# Patient Record
Sex: Female | Born: 1990 | Race: Black or African American | Hispanic: No | Marital: Single | Smoking: Current every day smoker
Health system: Southern US, Community
[De-identification: ages and names within clinical notes are randomized; demographics above are authoritative.]

## PROBLEM LIST (undated history)

## (undated) DIAGNOSIS — C50919 Malignant neoplasm of unspecified site of unspecified female breast: Secondary | ICD-10-CM

## (undated) HISTORY — DX: Malignant neoplasm of unspecified site of unspecified female breast: C50.919

---

## 2012-11-08 ENCOUNTER — Emergency Department (HOSPITAL_COMMUNITY)
Admission: EM | Admit: 2012-11-08 | Discharge: 2012-11-08 | Disposition: A | Discharge status: Home / Self Care | Payer: Self-pay | Attending: Emergency Medicine | Admitting: Emergency Medicine

## 2012-11-08 ENCOUNTER — Emergency Department (HOSPITAL_COMMUNITY): Payer: Self-pay

## 2012-11-08 ENCOUNTER — Encounter (HOSPITAL_COMMUNITY): Payer: Self-pay | Admitting: Emergency Medicine

## 2012-11-08 DIAGNOSIS — R071 Chest pain on breathing: Secondary | ICD-10-CM | POA: Insufficient documentation

## 2012-11-08 DIAGNOSIS — J3489 Other specified disorders of nose and nasal sinuses: Secondary | ICD-10-CM | POA: Insufficient documentation

## 2012-11-08 DIAGNOSIS — W57XXXA Bitten or stung by nonvenomous insect and other nonvenomous arthropods, initial encounter: Secondary | ICD-10-CM

## 2012-11-08 DIAGNOSIS — Y9319 Activity, other involving water and watercraft: Secondary | ICD-10-CM | POA: Insufficient documentation

## 2012-11-08 DIAGNOSIS — R0789 Other chest pain: Secondary | ICD-10-CM

## 2012-11-08 DIAGNOSIS — F17200 Nicotine dependence, unspecified, uncomplicated: Secondary | ICD-10-CM | POA: Insufficient documentation

## 2012-11-08 DIAGNOSIS — L089 Local infection of the skin and subcutaneous tissue, unspecified: Secondary | ICD-10-CM | POA: Insufficient documentation

## 2012-11-08 DIAGNOSIS — R059 Cough, unspecified: Secondary | ICD-10-CM | POA: Insufficient documentation

## 2012-11-08 DIAGNOSIS — R05 Cough: Secondary | ICD-10-CM | POA: Insufficient documentation

## 2012-11-08 DIAGNOSIS — Y929 Unspecified place or not applicable: Secondary | ICD-10-CM | POA: Insufficient documentation

## 2012-11-08 MED ORDER — SULFAMETHOXAZOLE-TRIMETHOPRIM 800-160 MG PO TABS: 1.0000 | ORAL_TABLET | Freq: Two times a day (BID) | ORAL | Status: DC

## 2012-11-08 NOTE — ED Notes (Signed)
Pt c/o possible insect bite to posterior r forearm just below elbow x 5 days ago. Pt had it I&D and packed. States it supposed to be repacked today. Pt c/o central chest tightness/aching started today. Denies sob/n/v/d/dizziness. Pt c/o congestion/cough after insect bite. Has been coughing yellow sputum x 2 days now. Alert/oriented. Nad. No resp distress. Wound to elbow has no s/s of infection at this time.

## 2012-11-08 NOTE — ED Provider Notes (Signed)
History     CSN: [id number]  Arrival date & time 11/08/12  1015   First MD Initiated Contact with Patient 11/08/12 1030      Chief Complaint  Patient presents with  . Chest Pain  . Insect Bite    (Consider location/radiation/quality/duration/timing/severity/associated sxs/prior treatment) Patient is a 22 y.o. female presenting with chest pain. The history is provided by the patient.  Chest Pain Chest pain location: midchest. Pain quality: sharp   Pain radiates to:  Does not radiate Pain radiates to the back: no   Pain severity:  Moderate Onset quality:  Gradual Duration:  1 day Timing:  Intermittent Progression:  Unchanged Chronicity:  New Context: movement   Context comment:  Coughing Relieved by:  None tried Associated symptoms: cough   Associated symptoms: no abdominal pain, no anxiety, no back pain, no fever, no nausea, no shortness of breath and not vomiting    Donna [name] is a 22 y.o. female who presents to the ED for recheck of a wound on her right forearm that required I&D 5 days ago. She has been taking Clindamycin. She also complains of chest pain that she has had with a cough for 2 days. The cough is productive with yellow sputum. The chest pain is sharp and increases with cough or movement. She denies shortness of breath, feeling weak, nausea or vomiting or other problems.   History reviewed. No pertinent past medical history.  History reviewed. No pertinent past surgical history.  History reviewed. No pertinent family history.  History  Substance Use Topics  . Smoking status: Current Every Day Smoker  . Smokeless tobacco: Not on file  . Alcohol Use: Yes     Comment: weekends    OB History   Grav Para Term Preterm Abortions TAB SAB Ect Mult Living                  Review of Systems  Constitutional: Negative for fever, chills and activity change.  HENT: Negative for neck pain.   Respiratory: Positive for cough. Negative for shortness of  breath.   Cardiovascular: Positive for chest pain.  Gastrointestinal: Negative for nausea, vomiting and abdominal pain.  Musculoskeletal: Negative for back pain.  Psychiatric/Behavioral: The patient is not nervous/anxious.     Allergies  Review of patient's allergies indicates no known allergies.  Home Medications  No current outpatient prescriptions on file.  LMP 10/16/2012  Physical Exam  Nursing note and vitals reviewed. Constitutional: She is oriented to person, place, and time. She appears well-developed and well-nourished. No distress.  HENT:  Head: Normocephalic and atraumatic.  Eyes: EOM are normal. Pupils are equal, round, and reactive to light.  Neck: Neck supple.  Cardiovascular: Normal rate, regular rhythm and normal heart sounds.   Pulmonary/Chest: Effort normal and breath sounds normal. No respiratory distress. She exhibits tenderness.  Abdominal: Soft. There is no tenderness.  Musculoskeletal: She exhibits no edema.  There is a wound noted to the right forearm where the patient had an abscess and it was I&D. The area is healing well  Neurological: She is alert and oriented to person, place, and time. No cranial nerve deficit.  Skin: Skin is warm and dry.  Psychiatric: She has a normal mood and affect. Her behavior is normal. Judgment and thought content normal.  Dg Chest 2 View  11/08/2012   *RADIOLOGY REPORT*  Clinical Data: Spider bite.  Chest pain.  CHEST - 2 VIEW  Comparison: No priors.  Findings: Lung volumes are normal.  No consolidative airspace disease.  No pleural effusions.  No pneumothorax.  No pulmonary nodule or mass noted.  Pulmonary vasculature and the cardiomediastinal silhouette are within normal limits.  IMPRESSION: 1. No radiographic evidence of acute cardiopulmonary disease.   Original Report Authenticated By: Trudie Reed, M.D.     ED Course  Procedures (including critical care time) Dr. Adriana Simas in to examine the patient. Discussed plan of care.   MDM  22 y.o. female with chest wall pain, cough and congestion. Healing wound right forearm. Will change antibiotic from clindamycin to Septra DS. She will follow up with her doctor.  Discussed with the patient and all questioned fully answered. She will return if any problems arise.    Medication List    TAKE these medications       sulfamethoxazole-trimethoprim 800-160 MG per tablet  Commonly known as:  SEPTRA DS  Take 1 tablet by mouth every 12 (twelve) hours.      ASK your doctor about these medications       clindamycin 150 MG capsule  Commonly known as:  CLEOCIN  Take 300 mg by mouth 3 (three) times daily. Take for 7 days, patient started medication on 11/07/2012     NYQUIL COLD & FLU PO  Take 10 mLs by mouth at bedtime as needed (cough).               [place], [place] 11/08/12 [phone]

## 2012-11-10 NOTE — ED Provider Notes (Signed)
Medical screening examination/treatment/procedure(s) were conducted as a shared visit with non-physician practitioner(s) and myself.  I personally evaluated the patient during the encounter.  Will change to anti MRSA ATB  Donnetta Hutching, MD 11/10/12 1121

## 2015-08-29 ENCOUNTER — Ambulatory Visit (INDEPENDENT_AMBULATORY_CARE_PROVIDER_SITE_OTHER): Payer: BLUE CROSS/BLUE SHIELD | Admitting: Physician Assistant

## 2015-08-29 VITALS — BP 120/76 | HR 46 | Temp 98.5°F | Resp 14 | Ht 75.0 in | Wt 177.6 lb

## 2015-08-29 DIAGNOSIS — M25511 Pain in right shoulder: Secondary | ICD-10-CM

## 2015-08-29 DIAGNOSIS — Z0000 Encounter for general adult medical examination without abnormal findings: Secondary | ICD-10-CM

## 2015-08-29 DIAGNOSIS — Z13220 Encounter for screening for lipoid disorders: Secondary | ICD-10-CM | POA: Diagnosis not present

## 2015-08-29 DIAGNOSIS — Z131 Encounter for screening for diabetes mellitus: Secondary | ICD-10-CM

## 2015-08-29 DIAGNOSIS — Z124 Encounter for screening for malignant neoplasm of cervix: Secondary | ICD-10-CM | POA: Diagnosis not present

## 2015-08-29 DIAGNOSIS — M25561 Pain in right knee: Secondary | ICD-10-CM | POA: Diagnosis not present

## 2015-08-29 DIAGNOSIS — M2170 Unequal limb length (acquired), unspecified site: Secondary | ICD-10-CM

## 2015-08-29 DIAGNOSIS — Z113 Encounter for screening for infections with a predominantly sexual mode of transmission: Secondary | ICD-10-CM

## 2015-08-29 LAB — COMPREHENSIVE METABOLIC PANEL
ALBUMIN: 4.6 g/dL (ref 3.6–5.1)
ALT: 10 U/L (ref 6–29)
AST: 21 U/L (ref 10–30)
Alkaline Phosphatase: 40 U/L (ref 33–115)
BUN: 10 mg/dL (ref 7–25)
CHLORIDE: 104 mmol/L (ref 98–110)
CO2: 26 mmol/L (ref 20–31)
CREATININE: 0.92 mg/dL (ref 0.50–1.10)
Calcium: 9.3 mg/dL (ref 8.6–10.2)
Glucose, Bld: 70 mg/dL (ref 65–99)
POTASSIUM: 4.3 mmol/L (ref 3.5–5.3)
Sodium: 141 mmol/L (ref 135–146)
TOTAL PROTEIN: 7.3 g/dL (ref 6.1–8.1)
Total Bilirubin: 0.7 mg/dL (ref 0.2–1.2)

## 2015-08-29 LAB — CBC
HCT: 44 % (ref 36.0–46.0)
HEMOGLOBIN: 14.9 g/dL (ref 12.0–15.0)
MCH: 29.4 pg (ref 26.0–34.0)
MCHC: 33.9 g/dL (ref 30.0–36.0)
MCV: 86.8 fL (ref 78.0–100.0)
MPV: 11.8 fL (ref 8.6–12.4)
Platelets: 233 10*3/uL (ref 150–400)
RBC: 5.07 MIL/uL (ref 3.87–5.11)
RDW: 13.3 % (ref 11.5–15.5)
WBC: 6.4 10*3/uL (ref 4.0–10.5)

## 2015-08-29 LAB — LIPID PANEL
CHOL/HDL RATIO: 1.8 ratio (ref <=5.0)
CHOLESTEROL: 129 mg/dL (ref 125–200)
HDL: 71 mg/dL (ref >=46)
LDL Cholesterol: 49 mg/dL (ref <130)
TRIGLYCERIDES: 46 mg/dL (ref <150)
VLDL: 9 mg/dL (ref <30)

## 2015-08-29 NOTE — Patient Instructions (Addendum)
You will get a phone call to make appt with ortho I will call you with your lab results. Keep a diary of the past 24 hours when you get the rash on your arms - what you were doing, where you were, what skin products you used, what foods you ate, etc. That may help you determine the cause of your allergy Return as needed.    IF you received an x-ray today, you will receive an invoice from [facility]. Please contact [facility] at [phone] with questions or concerns regarding your invoice.   IF you received labwork today, you will receive an invoice from [facility]. Please contact [facility] at [phone] with questions or concerns regarding your invoice.   Our billing staff will not be able to assist you with questions regarding bills from these companies.  You will be contacted with the lab results as soon as they are available. The fastest way to get your results is to activate your My Chart account. Instructions are located on the last page of this paperwork. If you have not heard from us regarding the results in 2 weeks, please contact this office.

## 2015-08-29 NOTE — Progress Notes (Signed)
[facility] [address], [place] [address] [phone]  Date:  08/29/2015   Name:  Donna CottonDestiny [name]   DOB:  1990/08/11   MRN:  [id number]  PCP:  No primary care provider on file.    Chief Complaint: Annual Exam; STD Testing; and Rash   History of Present Illness:  This is a 25 y.o. female who is presenting for CPE.  Complaints: rash that has occurred twice over the past month. Got hives on bilateral arms and hands that was very pruritic. She took benadryl and went away. Has no known allergies. Wondering if she needs to be sent to an allergist for further evaluation. LMP: 08/09/15.  Last pap: Last pap 2010. Wants to do that today. Sexual history: sexually active with women. Wanting STD testing today. Immunizations: up to date on immunizations. Dentist: last had dental visit 02/2015 Eye: doesn't wear glasses or contacts. Diet/Exercise: plays basketball for exercise. Also does drills with the [employer] and [employer]. States diet is variable. Sometimes eats healthy, sometimes doesn't.  Fam hx: does not know her family history except for DM.  Tobacco/alcohol/substance use: no/1 drink per week/no  Complaining of right shoulder pain x years. Right knee pain x several months. States her right leg is longer than her left and thinks that is what is causing her knee to hurt. She wants a referral to ortho.   Review of Systems:  Review of Systems  Constitutional: Negative.   HENT: Negative.   Eyes: Negative.   Respiratory: Negative.   Cardiovascular: Negative.   Gastrointestinal: Negative.   Endocrine: Negative.   Genitourinary: Negative.   Musculoskeletal: Negative.   Skin: Positive for rash.  Allergic/Immunologic: Negative.   Neurological: Negative.   Hematological: Negative.   Psychiatric/Behavioral: Negative.     There are no active problems to display for this patient.   Prior to Admission medications   Not on File    No Known Allergies  No past surgical  history on file.  Social History  Substance Use Topics  . Smoking status: Former Games developermoker  . Smokeless tobacco: None  . Alcohol Use: 0.0 oz/week    0 Standard drinks or equivalent per week     Comment: weekends    No family history on file.  Medication list has been reviewed and updated.  Physical Examination:  Physical Exam  Constitutional: She is oriented to person, place, and time. She appears well-developed and well-nourished. No distress.  HENT:  Head: Normocephalic and atraumatic.  Right Ear: Hearing, tympanic membrane, external ear and ear canal normal.  Left Ear: Hearing, tympanic membrane, external ear and ear canal normal.  Nose: Nose normal.  Mouth/Throat: Uvula is midline, oropharynx is clear and moist and mucous membranes are normal.  Eyes: Conjunctivae, EOM and lids are normal. Right eye exhibits no discharge. Left eye exhibits no discharge. No scleral icterus.  Neck: Trachea normal. Carotid bruit is not present. No thyromegaly present.  Cardiovascular: Normal rate, regular rhythm, normal heart sounds, intact distal pulses and normal pulses.   No murmur heard. Pulmonary/Chest: Effort normal and breath sounds normal. No respiratory distress. She has no wheezes. She has no rhonchi. She has no rales.  Breast exam declined  Abdominal: Soft. Normal appearance and bowel sounds are normal. She exhibits no abdominal bruit. There is no tenderness.  Genitourinary: Uterus normal. There is no lesion or injury on the right labia. There is no lesion or injury on the left labia. Cervix exhibits friability. Cervix exhibits no motion tenderness  and no discharge. Right adnexum displays no tenderness and no fullness. Left adnexum displays no tenderness and no fullness. No foreign body around the vagina. No vaginal discharge found.  Musculoskeletal: Normal range of motion.  FROM of bilateral shoulders. Pain with right shoulder abduction greater than 90 degrees. Catching in shoulder with  lowering arm.   Right leg almost 1 inch longer than left. Pain over lateral knee.  Lymphadenopathy:       Head (right side): No submental, no submandibular and no tonsillar adenopathy present.       Head (left side): No submental, no submandibular and no tonsillar adenopathy present.    She has no cervical adenopathy.  Neurological: She is alert and oriented to person, place, and time. She has normal strength and normal reflexes. No cranial nerve deficit or sensory deficit. Coordination and gait normal.  Skin: Skin is warm, dry and intact. No lesion and no rash noted.  Psychiatric: She has a normal mood and affect. Her speech is normal and behavior is normal. Thought content normal.   BP 120/76 mmHg  Pulse 46  Temp(Src) 98.5 F (36.9 C)  Resp 14  Ht  (1.905 m)  Wt 177 lb 9.6 oz (80.559 kg)  BMI 22.20 kg/m2  SpO2 99%  LMP 07/12/2015 (Approximate)   Visual Acuity Screening   Right eye Left eye Both eyes  Without correction:  With correction:      Assessment and Plan:  1. Annual physical exam Forms for insurance filled out. - CBC  2. Screen for STD (sexually transmitted disease) - RPR - HIV antibody - Trichomonas vaginalis, RNA  3. Diabetes mellitus screening - Comprehensive metabolic panel  4. Lipid screening - Lipid panel  5. Cervical cancer screening - Pap IG and Chlamydia/Gonococcus, NAA  6. Knee pain, right 7. Leg length discrepancy 8. Right shoulder pain - Ambulatory referral to Orthopedic Surgery   [place] Miners. Dyke Brackett, MHS [facility]  08/29/2015

## 2015-08-30 LAB — HIV ANTIBODY (ROUTINE TESTING W REFLEX): HIV 1&2 Ab, 4th Generation: NONREACTIVE

## 2015-08-30 LAB — PAP IG AND CT-NG NAA
CHLAMYDIA PROBE AMP: NOT DETECTED
GC PROBE AMP: NOT DETECTED

## 2015-08-30 LAB — TRICHOMONAS VAGINALIS, PROBE AMP: T vaginalis RNA: NEGATIVE

## 2015-08-30 LAB — RPR

## 2015-09-15 ENCOUNTER — Ambulatory Visit (INDEPENDENT_AMBULATORY_CARE_PROVIDER_SITE_OTHER): Payer: BLUE CROSS/BLUE SHIELD | Admitting: Physician Assistant

## 2015-09-15 VITALS — BP 122/72 | HR 78 | Temp 98.1°F | Resp 17 | Ht 75.5 in | Wt 185.0 lb

## 2015-09-15 DIAGNOSIS — J069 Acute upper respiratory infection, unspecified: Secondary | ICD-10-CM | POA: Diagnosis not present

## 2015-09-15 NOTE — Patient Instructions (Addendum)
Drink plenty of water (64 oz/day) and get plenty of rest. Take ibuprofen 600 mg three times a day or tylenol 1000 mg three times a day for your body aches and chills/hot flashes. If you have been prescribed mucinex, take twice a day with plenty of water. May take nyquil at night to help you sleep. If your symptoms are not improving in 1 week, return to clinic.     IF you received an x-ray today, you will receive an invoice from [facility]. Please contact [facility] at [phone] with questions or concerns regarding your invoice.   IF you received labwork today, you will receive an invoice from [facility]. Please contact [facility] at [phone] with questions or concerns regarding your invoice.   Our billing staff will not be able to assist you with questions regarding bills from these companies.  You will be contacted with the lab results as soon as they are available. The fastest way to get your results is to activate your My Chart account. Instructions are located on the last page of this paperwork. If you have not heard from us regarding the results in 2 weeks, please contact this office.

## 2015-09-15 NOTE — Progress Notes (Signed)
[facility] [address], [place] [address] [phone]  Date:  09/15/2015   Name:  Donna CottonDestiny [name]   DOB:  04-26-1991   MRN:  [id number]  PCP:  No primary care provider on file.    Chief Complaint: Sinusitis; Cough; and URI   History of Present Illness:  This is a 25 y.o. female who is presenting with cough and nasal congestion x 2 days ago. States her whole body hurts. She is having alternating hot flashes and chills. Cough is productive. No sob or wheezing. Denies sore throat.  Aggravating/alleviating factors: has not taken anything for her symptoms. History of asthma: no History of env allergies: no Tobacco use: black and milds, 2 per week. No sick contacts.  Review of Systems:  Review of Systems See HPI  There are no active problems to display for this patient.   Prior to Admission medications   Not on File    No Known Allergies  History reviewed. No pertinent past surgical history.  Social History  Substance Use Topics  . Smoking status: Current Every Day Smoker -- 0.50 packs/day for 9 years    Types: Cigars, Cigarettes  . Smokeless tobacco: None  . Alcohol Use: 0.0 oz/week    0 Standard drinks or equivalent per week     Comment: weekends    History reviewed. No pertinent family history.  Medication list has been reviewed and updated.  Physical Examination:  Physical Exam  Constitutional: She is oriented to person, place, and time. She appears well-developed and well-nourished. No distress.  HENT:  Head: Normocephalic and atraumatic.  Right Ear: Hearing, tympanic membrane, external ear and ear canal normal.  Left Ear: Hearing, tympanic membrane, external ear and ear canal normal.  Nose: Nose normal.  Mouth/Throat: Uvula is midline, oropharynx is clear and moist and mucous membranes are normal.  Eyes: Conjunctivae and lids are normal. Right eye exhibits no discharge. Left eye exhibits no discharge. No scleral icterus.   Cardiovascular: Normal rate, regular rhythm, normal heart sounds and normal pulses.   No murmur heard. Pulmonary/Chest: Effort normal and breath sounds normal. No respiratory distress. She has no wheezes. She has no rhonchi. She has no rales.  Musculoskeletal: Normal range of motion.  Lymphadenopathy:       Head (right side): Tonsillar adenopathy present. No submental and no submandibular adenopathy present.       Head (left side): No submental, no submandibular and no tonsillar adenopathy present.    She has no cervical adenopathy.  Neurological: She is alert and oriented to person, place, and time.  Skin: Skin is warm, dry and intact. No lesion and no rash noted.  Psychiatric: She has a normal mood and affect. Her speech is normal and behavior is normal. Thought content normal.   BP 122/72 mmHg  Pulse 78  Temp(Src) 98.1 F (36.7 C) (Oral)  Resp 17  Ht 6' 3.5" (1.918 m)  Wt 185 lb (83.915 kg)  BMI 22.81 kg/m2  SpO2 98%  LMP 09/09/2015  Assessment and Plan:  1. Viral URI Likely viral illness, possible flu, pt declined flu test d/t cost. Focus is on supportive care. She declined any prescription meds, wanted to take OTC meds. Return in 1 week if symptoms do not improve or at any time if symptoms worsen.    Roswell MinersNicole V. Dyke BrackettBush, PA-C, MHS [facility]  09/15/2015

## 2015-09-22 ENCOUNTER — Ambulatory Visit (INDEPENDENT_AMBULATORY_CARE_PROVIDER_SITE_OTHER): Payer: BLUE CROSS/BLUE SHIELD | Admitting: Physician Assistant

## 2015-09-22 VITALS — BP 112/80 | HR 74 | Temp 98.4°F | Resp 18 | Ht 75.5 in | Wt 188.2 lb

## 2015-09-22 DIAGNOSIS — J069 Acute upper respiratory infection, unspecified: Secondary | ICD-10-CM

## 2015-09-22 DIAGNOSIS — R319 Hematuria, unspecified: Secondary | ICD-10-CM

## 2015-09-22 DIAGNOSIS — R350 Frequency of micturition: Secondary | ICD-10-CM

## 2015-09-22 DIAGNOSIS — N3090 Cystitis, unspecified without hematuria: Secondary | ICD-10-CM | POA: Diagnosis not present

## 2015-09-22 DIAGNOSIS — R112 Nausea with vomiting, unspecified: Secondary | ICD-10-CM

## 2015-09-22 LAB — POCT URINALYSIS DIP (MANUAL ENTRY)
BILIRUBIN UA: NEGATIVE
GLUCOSE UA: NEGATIVE
Ketones, POC UA: NEGATIVE
Nitrite, UA: NEGATIVE
Protein Ur, POC: 100 — AB
SPEC GRAV UA: 1.01
UROBILINOGEN UA: 0.2
pH, UA: 6.5

## 2015-09-22 LAB — POC MICROSCOPIC URINALYSIS (UMFC): Mucus: ABSENT

## 2015-09-22 MED ORDER — NITROFURANTOIN MONOHYD MACRO 100 MG PO CAPS: 100.0000 mg | ORAL_CAPSULE | Freq: Two times a day (BID) | ORAL | Status: AC

## 2015-09-22 NOTE — Patient Instructions (Signed)
Drink plenty of water (at least 64 oz per day). Take antibiotic as prescribed until finished - twice a day for 7 days Cranberry juice/pills can help. You can buy AZO over the counter and take as needed for pain. Follow directions on the packaging. I will call you with results from your urine culture. If symptoms are not improving in 4-5 days, return to clinic.

## 2015-09-22 NOTE — Progress Notes (Signed)
[facility] [address], [place] [address] [phone]  Date:  09/22/2015   Name:  Donna CottonDestiny [name]   DOB:  1990/07/10   MRN:  [id number]  PCP:  No primary care provider on file.    Chief Complaint: Follow-up and Urinary Frequency   History of Present Illness:  This is a 25 y.o. female who is presenting for follow up. She was seen here on 4/13 with flu like symptoms -- body aches, hot flashes, chills, productive cough, nasal congestion. She refused any rx medicine, wanted to try otc medicines. She has been trying mucinex and helping a lot. The cough and nasal congestion is much better.   She is complaining today of new urinary frequency x 2 days. Having dysuria. Some blood in urine. Having some lower abdominal pain with urinating. No back pain. No vaginal discharge. She had some nausea yesterday morning and today. Vomited up her breakfast. Took mucinex and felt better the rest of the day. Has had 1 episode of diarrhea, nonbloody. LMP 09/09/15 Never had kidney stone. Last UTI was several years ago but states her symptoms are very similar to that time. Had full STD testing <1 month ago and all negative. No change in sexual partner since then. She has been in a long term relationship with her girlfriend.  She states there is no chance she is pregnant, only sexually active with females.  Review of Systems:  Review of Systems See HPI   There are no active problems to display for this patient.   Prior to Admission medications   Not on File    No Known Allergies  History reviewed. No pertinent past surgical history.  Social History  Substance Use Topics  . Smoking status: Current Every Day Smoker -- 0.50 packs/day for 9 years    Types: Cigars, Cigarettes  . Smokeless tobacco: None  . Alcohol Use: 0.0 oz/week    0 Standard drinks or equivalent per week     Comment: weekends    History reviewed. No pertinent family history.  Medication list has been  reviewed and updated.  Physical Examination:  Physical Exam  Constitutional: She is oriented to person, place, and time. She appears well-developed and well-nourished. No distress.  HENT:  Head: Normocephalic and atraumatic.  Right Ear: Hearing normal.  Left Ear: Hearing normal.  Nose: Nose normal.  Mouth/Throat: Uvula is midline, oropharynx is clear and moist and mucous membranes are normal.  Eyes: Conjunctivae and lids are normal. Right eye exhibits no discharge. Left eye exhibits no discharge. No scleral icterus.  Cardiovascular: Normal rate, regular rhythm, normal heart sounds and normal pulses.   No murmur heard. Pulmonary/Chest: Effort normal and breath sounds normal. No respiratory distress. She has no wheezes. She has no rhonchi. She has no rales.  Abdominal: Soft. Normal appearance. There is no tenderness. There is no CVA tenderness.  No suprapubic tenderness  Musculoskeletal: Normal range of motion.  Lymphadenopathy:       Head (right side): No submental, no submandibular and no tonsillar adenopathy present.       Head (left side): No submental, no submandibular and no tonsillar adenopathy present.    She has no cervical adenopathy.  Neurological: She is alert and oriented to person, place, and time.  Skin: Skin is warm, dry and intact. No lesion and no rash noted.  Psychiatric: She has a normal mood and affect. Her speech is normal and behavior is normal. Thought content normal.   BP  112/80 mmHg  Pulse 74  Temp(Src) 98.4 F (36.9 C) (Oral)  Resp 18  Ht 6' 3.5" (1.918 m)  Wt 188 lb 3.2 oz (85.367 kg)  BMI 23.21 kg/m2  SpO2 98%  LMP 09/09/2015  Results for orders placed or performed in visit on 09/22/15  POCT Microscopic Urinalysis ([facility])  Result Value Ref Range   WBC,UR,HPF,POC Moderate (A) None WBC/hpf   RBC,UR,HPF,POC Too numerous to count  (A) None RBC/hpf   Bacteria Few (A) None, Too numerous to count   Mucus Absent Absent   Epithelial Cells, UR Per  Microscopy Few (A) None, Too numerous to count cells/hpf  POCT urinalysis dipstick  Result Value Ref Range   Color, UA yellow yellow   Clarity, UA cloudy (A) clear   Glucose, UA negative negative   Bilirubin, UA negative negative   Ketones, POC UA negative negative   Spec Grav, UA 1.010    Blood, UA large (A) negative   pH, UA 6.5    Protein Ur, POC =100 (A) negative   Urobilinogen, UA 0.2    Nitrite, UA Negative Negative   Leukocytes, UA moderate (2+) (A) Negative   Assessment and Plan:  1. Cystitis 2. Urine frequency 3. Hematuria 4. Nausea with vomiting Large blood and mod leuks in urine. Will treat as cystitis with macrobid, urine culture pending. Less likely, could be a kidney stone but not having any renal colic symptoms. She will return if not getting better in 4-5 days or sooner if symptoms worsen. - Urine culture - nitrofurantoin, macrocrystal-monohydrate, (MACROBID) 100 MG capsule; Take 1 capsule (100 mg total) by mouth 2 (two) times daily.  Dispense: 14 capsule; Refill: 0 - POCT Microscopic Urinalysis ([facility]) - POCT urinalysis dipstick  5. Viral URI Symptoms have improved greatly. Continue mucinex prn.  [place] Miners Dyke Brackett, MHS [facility]  09/22/2015

## 2015-09-23 LAB — URINE CULTURE
COLONY COUNT: NO GROWTH
ORGANISM ID, BACTERIA: NO GROWTH

## 2016-01-30 ENCOUNTER — Ambulatory Visit: Payer: BLUE CROSS/BLUE SHIELD

## 2016-06-26 ENCOUNTER — Emergency Department (HOSPITAL_COMMUNITY)
Admission: EM | Admit: 2016-06-26 | Discharge: 2016-06-26 | Disposition: A | Discharge status: Home / Self Care | Payer: BLUE CROSS/BLUE SHIELD | Attending: Emergency Medicine | Admitting: Emergency Medicine

## 2016-06-26 ENCOUNTER — Emergency Department (HOSPITAL_COMMUNITY): Payer: BLUE CROSS/BLUE SHIELD

## 2016-06-26 ENCOUNTER — Encounter (HOSPITAL_COMMUNITY): Payer: Self-pay | Admitting: Emergency Medicine

## 2016-06-26 DIAGNOSIS — F17210 Nicotine dependence, cigarettes, uncomplicated: Secondary | ICD-10-CM | POA: Diagnosis not present

## 2016-06-26 DIAGNOSIS — F17290 Nicotine dependence, other tobacco product, uncomplicated: Secondary | ICD-10-CM | POA: Diagnosis not present

## 2016-06-26 DIAGNOSIS — R002 Palpitations: Secondary | ICD-10-CM | POA: Diagnosis not present

## 2016-06-26 DIAGNOSIS — R079 Chest pain, unspecified: Secondary | ICD-10-CM | POA: Diagnosis present

## 2016-06-26 LAB — CBC
HCT: 43 % (ref 36.0–46.0)
HEMOGLOBIN: 14.2 g/dL (ref 12.0–15.0)
MCH: 29.2 pg (ref 26.0–34.0)
MCHC: 33 g/dL (ref 30.0–36.0)
MCV: 88.3 fL (ref 78.0–100.0)
Platelets: 199 10*3/uL (ref 150–400)
RBC: 4.87 MIL/uL (ref 3.87–5.11)
RDW: 13 % (ref 11.5–15.5)
WBC: 5.1 10*3/uL (ref 4.0–10.5)

## 2016-06-26 LAB — BASIC METABOLIC PANEL
ANION GAP: 3 — AB (ref 5–15)
BUN: <5 mg/dL — ABNORMAL LOW (ref 6–20)
CHLORIDE: 108 mmol/L (ref 101–111)
CO2: 30 mmol/L (ref 22–32)
Calcium: 9.5 mg/dL (ref 8.9–10.3)
Creatinine, Ser: 0.88 mg/dL (ref 0.44–1.00)
GFR calc Af Amer: >60 mL/min (ref >60)
Glucose, Bld: 89 mg/dL (ref 65–99)
Potassium: 4.5 mmol/L (ref 3.5–5.1)
SODIUM: 141 mmol/L (ref 135–145)

## 2016-06-26 LAB — I-STAT TROPONIN, ED: Troponin i, poc: 0 ng/mL (ref 0.00–0.08)

## 2016-06-26 NOTE — ED Triage Notes (Signed)
The patient said she has been having fluttering and chest pain for two days.  The patient said she thought it was going to go away and it did not.  She denies taking anything with it.  She also felt like she was going to faint.  She rates her pain 5/10.

## 2016-06-26 NOTE — ED Provider Notes (Signed)
[facility]-EMERGENCY DEPT Provider Note   CSN: [id number] Arrival date & time: 06/26/16  1603  By signing my name below, I, [name], attest that this documentation has been prepared under the direction and in the presence of Rolland PorterMark Roslynn Holte, MD . Electronically Signed: [name], ED Scribe. 06/26/2016. 8:02 PM.    History   Chief Complaint Chief Complaint  Patient presents with  . Chest Pain    The patient said she has been having fluttering and chest pain for two days.  The patient said she thought it was going to go away and it did not.  She denies taking anything with it.  She also felt like she was going to faint.  She rates her pain 5/10.    The history is provided by the patient. No language interpreter was used.   HPI Comments:  Donna [name] is a 26 y.o. female who presents to the Emergency Department complaining of intermittent "fluttering" chest pain x 2 days. She rates her current pain at 5/10, and describes the pain as "throbbing." She states that the pain makes her feel like she is going to faint. 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 No alleviating factors noted. Pt denies other associated symptoms.   History reviewed. No pertinent past medical history.  There are no active problems to display for this patient.   History reviewed. No pertinent surgical history.  OB History    No data available       Home Medications    Prior to Admission medications   Not on File    Family History History reviewed. No pertinent family history.  Social History Social History  Substance Use Topics  . Smoking status: Current Every Day Smoker    Packs/day: 0.50    Years: 9.00    Types: Cigars, Cigarettes  . Smokeless tobacco: Never Used  . Alcohol use 0.0 oz/week     Comment: weekends     Allergies   Patient has no known allergies.   Review of Systems Review of Systems  Constitutional: Negative for appetite change, chills,  diaphoresis, fatigue and fever.  HENT: Negative for mouth sores, sore throat and trouble swallowing.   Eyes: Negative for visual disturbance.  Respiratory: Negative for cough, chest tightness, shortness of breath and wheezing.   Cardiovascular: Positive for chest pain.  Gastrointestinal: Negative for abdominal distention, abdominal pain, diarrhea, nausea and vomiting.  Endocrine: Negative for polydipsia, polyphagia and polyuria.  Genitourinary: Negative for dysuria, frequency and hematuria.  Musculoskeletal: Negative for gait problem.  Skin: Negative for color change, pallor and rash.  Neurological: Negative for dizziness, syncope, light-headedness and headaches.  Hematological: Does not bruise/bleed easily.  Psychiatric/Behavioral: Negative for behavioral problems and confusion.  All other systems reviewed and are negative.    Physical Exam Updated Vital Signs Ht 6\' 4"  (1.93 m)   Wt 184 lb (83.5 kg)   LMP 05/15/2016 Comment: period is abnormal  BMI 22.40 kg/m   Physical Exam  Constitutional: She is oriented to person, place, and time. She appears well-developed and well-nourished. No distress.  HENT:  Head: Normocephalic.  Eyes: Conjunctivae are normal. Pupils are equal, round, and reactive to light. No scleral icterus.  Neck: Normal range of motion. Neck supple. No thyromegaly present.  Cardiovascular: Normal rate and regular rhythm.  Exam reveals no gallop and no friction rub.   No murmur heard. Pulmonary/Chest: Effort normal and breath sounds normal. No respiratory distress. She has no wheezes. She has  no rales.  Abdominal: Soft. Bowel sounds are normal. She exhibits no distension. There is no tenderness. There is no rebound.  Musculoskeletal: Normal range of motion.  Neurological: She is alert and oriented to person, place, and time.  Skin: Skin is warm and dry. No rash noted.  Psychiatric: She has a normal mood and affect. Her behavior is normal.     ED Treatments /  Results  DIAGNOSTIC STUDIES:  Oxygen Saturation is 100% on RA, normal by my interpretation.    COORDINATION OF CARE:  7:57 PM Discussed treatment plan with pt at bedside and pt agreed to plan.   Labs (all labs ordered are listed, but only abnormal results are displayed) Labs Reviewed  BASIC METABOLIC PANEL - Abnormal; Notable for the following:       Result Value   BUN <5 (*)    Anion gap 3 (*)    All other components within normal limits  CBC  I-STAT TROPOININ, ED    EKG  EKG Interpretation  Date/Time:  Tuesday June 26 2016 16:34:06 EST Ventricular Rate:  65 PR Interval:  154 QRS Duration: 86 QT Interval:  410 QTC Calculation: 426 R Axis:   96 Text Interpretation:  Normal sinus rhythm Rightward axis Slow R progression Abnormal ECG Confirmed by Fayrene Fearing  MD, Elisheba Mcdonnell ([id number]) on 06/26/2016 7:53:36 PM       Radiology Dg Chest 2 View  Result Date: 06/26/2016 CLINICAL DATA:  Chest pain for 3 days EXAM: CHEST  2 VIEW COMPARISON:  None. FINDINGS: Normal mediastinum and cardiac silhouette. Normal pulmonary vasculature. No evidence of effusion, infiltrate, or pneumothorax. No acute bony abnormality. IMPRESSION: No acute cardiopulmonary process. Electronically Signed   By: Donna Bi M.D.   On: 06/26/2016 17:02    Procedures Procedures (including critical care time)  Medications Ordered in ED Medications - No data to display   Initial Impression / Assessment and Plan / ED Course  I have reviewed the triage vital signs and the nursing notes.  Pertinent labs & imaging results that were available during my care of the patient were reviewed by me and considered in my medical decision making (see chart for details).     Patient describes symptoms that sound quite like PVCs. None noted on 12-lead EKG or monitor and she is currently symptomatically. Describes the symptoms for greater than a year. Bastard avoids excessive alcohol, any caffeine or tobacco, and over-the-counter  cough or cold medications. She states she normally drinks red bull and smokes "some". Given cardiology, may require Holter monitoring.  Final Clinical Impressions(s) / ED Diagnoses   Final diagnoses:  Palpitations    New Prescriptions New Prescriptions   No medications on file  I personally performed the services described in this documentation, which was scribed in my presence. The recorded information has been reviewed and is accurate.     Rolland Porter, MD 06/26/16 2011

## 2016-06-26 NOTE — ED Notes (Addendum)
Pt verbalized understanding of d/c instructions and has no further questions. Pt is stable, A&Ox4, VSS. Pt unable to sign due to electronic pad not working. Pt has no questions

## 2016-06-26 NOTE — Discharge Instructions (Signed)
Avoid alcohol, caffeine, and tobacco.  Avoid over-the-counter cough or cold medications  Call [facility] for follow up appointment.

## 2017-01-01 DIAGNOSIS — F17290 Nicotine dependence, other tobacco product, uncomplicated: Secondary | ICD-10-CM | POA: Insufficient documentation

## 2017-01-01 DIAGNOSIS — R197 Diarrhea, unspecified: Secondary | ICD-10-CM | POA: Insufficient documentation

## 2017-01-01 LAB — COMPREHENSIVE METABOLIC PANEL
ALT: 14 U/L (ref 14–54)
AST: 24 U/L (ref 15–41)
Albumin: 4.3 g/dL (ref 3.5–5.0)
Alkaline Phosphatase: 39 U/L (ref 38–126)
Anion gap: 6 (ref 5–15)
BUN: 10 mg/dL (ref 6–20)
CHLORIDE: 107 mmol/L (ref 101–111)
CO2: 28 mmol/L (ref 22–32)
Calcium: 9.4 mg/dL (ref 8.9–10.3)
Creatinine, Ser: 1.02 mg/dL — ABNORMAL HIGH (ref 0.44–1.00)
Glucose, Bld: 91 mg/dL (ref 65–99)
POTASSIUM: 5.3 mmol/L — AB (ref 3.5–5.1)
SODIUM: 141 mmol/L (ref 135–145)
Total Bilirubin: 0.7 mg/dL (ref 0.3–1.2)
Total Protein: 7 g/dL (ref 6.5–8.1)

## 2017-01-01 LAB — I-STAT BETA HCG BLOOD, ED (MC, WL, AP ONLY): I-stat hCG, quantitative: <5 m[IU]/mL (ref <5)

## 2017-01-01 LAB — CBC
HCT: 41.8 % (ref 36.0–46.0)
Hemoglobin: 14.3 g/dL (ref 12.0–15.0)
MCH: 29.9 pg (ref 26.0–34.0)
MCHC: 34.2 g/dL (ref 30.0–36.0)
MCV: 87.4 fL (ref 78.0–100.0)
Platelets: 176 10*3/uL (ref 150–400)
RBC: 4.78 MIL/uL (ref 3.87–5.11)
RDW: 12.6 % (ref 11.5–15.5)
WBC: 6.6 10*3/uL (ref 4.0–10.5)

## 2017-01-01 LAB — URINALYSIS, ROUTINE W REFLEX MICROSCOPIC
Bilirubin Urine: NEGATIVE
Glucose, UA: NEGATIVE mg/dL
Hgb urine dipstick: NEGATIVE
Ketones, ur: NEGATIVE mg/dL
Leukocytes, UA: NEGATIVE
Nitrite: NEGATIVE
Protein, ur: NEGATIVE mg/dL
Specific Gravity, Urine: 1.023 (ref 1.005–1.030)
pH: 7 (ref 5.0–8.0)

## 2017-01-01 LAB — LIPASE, BLOOD: LIPASE: 42 U/L (ref 11–51)

## 2017-01-01 NOTE — ED Triage Notes (Signed)
Pt c/o LUQ with diarrhea x 3days describes as cramping and dull.

## 2017-01-02 ENCOUNTER — Emergency Department (HOSPITAL_COMMUNITY)
Admission: EM | Admit: 2017-01-02 | Discharge: 2017-01-02 | Disposition: A | Discharge status: Home / Self Care | Payer: BLUE CROSS/BLUE SHIELD | Attending: Emergency Medicine | Admitting: Emergency Medicine

## 2017-01-02 DIAGNOSIS — R197 Diarrhea, unspecified: Secondary | ICD-10-CM

## 2017-01-02 DIAGNOSIS — R1012 Left upper quadrant pain: Secondary | ICD-10-CM

## 2017-01-02 NOTE — ED Notes (Signed)
Pt refusing EKG

## 2017-01-02 NOTE — ED Provider Notes (Signed)
[facility] Provider Note   CSN: [id number] Arrival date & time: 01/01/17  1815     History   Chief Complaint Chief Complaint  Patient presents with  . Abdominal Pain  . Diarrhea    HPI Donna [name] is a 26 y.o. female.  The history is provided by the patient.  Abdominal Pain   This is a new problem. The current episode started more than 2 days ago. The problem occurs daily. The problem has not changed since onset.The pain is located in the LUQ. The quality of the pain is aching. The pain is at a severity of 6/10. Associated symptoms include diarrhea and nausea. Pertinent negatives include fever and vomiting. Nothing aggravates the symptoms. Nothing relieves the symptoms.  Diarrhea   Associated symptoms include abdominal pain. Pertinent negatives include no vomiting.   Pt reports onset of LUQ pain and diarrhea bout 3 days ago No blood in stool No fever No travel No sick contacts No other acute complaints  PMH - none Soc hx - no recent travel OB History    No data available       Home Medications    Prior to Admission medications   Not on File    Family History No family history on file.  Social History Social History  Substance Use Topics  . Smoking status: Current Every Day Smoker    Packs/day: 0.50    Years: 9.00    Types: Cigars, Cigarettes  . Smokeless tobacco: Never Used  . Alcohol use 0.0 oz/week     Comment: weekends     Allergies   Patient has no known allergies.   Review of Systems Review of Systems  Constitutional: Negative for fever.  Gastrointestinal: Positive for abdominal pain, diarrhea and nausea. Negative for vomiting.  All other systems reviewed and are negative.    Physical Exam Updated Vital Signs BP 121/80 (BP Location: Right Arm)   Pulse (!) 46   Temp 98.2 F (36.8 C) (Oral)   Resp 18   Ht 1.956 m (6\' 5" )   Wt 78.9 kg (174 lb)   SpO2 100%   BMI 20.63 kg/m   Physical Exam CONSTITUTIONAL: Well  developed/well nourished, sitting up, no distress HEAD: Normocephalic/atraumatic EYES: EOMI/PERRL ENMT: Mucous membranes moist NECK: supple no meningeal signs SPINE/BACK:entire spine nontender CV: S1/S2 noted, no murmurs/rubs/gallops noted LUNGS: Lungs are clear to auscultation bilaterally, no apparent distress ABDOMEN: soft, mild LUQ tenderness, no rebound or guarding, bowel sounds noted throughout abdomen GU:no cva tenderness NEURO: Pt is awake/alert/appropriate, moves all extremitiesx4.  No facial droop.   EXTREMITIES: pulses normal/equal, full ROM SKIN: warm, color normal PSYCH: no abnormalities of mood noted, alert and oriented to situation   ED Treatments / Results  Labs (all labs ordered are listed, but only abnormal results are displayed) Labs Reviewed  COMPREHENSIVE METABOLIC PANEL - Abnormal; Notable for the following:       Result Value   Potassium 5.3 (*)    Creatinine, Ser 1.02 (*)    All other components within normal limits  LIPASE, BLOOD  CBC  URINALYSIS, ROUTINE W REFLEX MICROSCOPIC  I-STAT BETA HCG BLOOD, ED ([facility], [facility], [facility] ONLY)    EKG  EKG Interpretation None       Radiology No results found.  Procedures Procedures (including critical care time)  Medications Ordered in ED Medications - No data to display   Initial Impression / Assessment and Plan / ED Course  I have reviewed the triage vital signs and  the nursing notes.  Pertinent labs  results that were available during my care of the patient were reviewed by me and considered in my medical decision making (see chart for details).     Pt stable Labs reassuring   Pt with bradycardia, no complaints, no dizziness, refuses ekg and request d/c home Likely asymptomatic bradycardia  Final Clinical Impressions(s) / ED Diagnoses   Final diagnoses:  Diarrhea of presumed infectious origin  Left upper quadrant pain    New Prescriptions New Prescriptions   No medications on file       Zadie RhineWickline, [place] Behlke, MD 01/02/17 0225

## 2018-07-07 ENCOUNTER — Other Ambulatory Visit: Payer: Self-pay

## 2018-07-07 ENCOUNTER — Emergency Department (HOSPITAL_COMMUNITY): Payer: Self-pay

## 2018-07-07 ENCOUNTER — Encounter (HOSPITAL_COMMUNITY): Payer: Self-pay | Admitting: *Deleted

## 2018-07-07 ENCOUNTER — Emergency Department (HOSPITAL_COMMUNITY)
Admission: EM | Admit: 2018-07-07 | Discharge: 2018-07-08 | Disposition: A | Discharge status: Home / Self Care | Payer: Self-pay | Attending: Emergency Medicine | Admitting: Emergency Medicine

## 2018-07-07 DIAGNOSIS — Y92310 Basketball court as the place of occurrence of the external cause: Secondary | ICD-10-CM | POA: Insufficient documentation

## 2018-07-07 DIAGNOSIS — F17290 Nicotine dependence, other tobacco product, uncomplicated: Secondary | ICD-10-CM | POA: Insufficient documentation

## 2018-07-07 DIAGNOSIS — Y998 Other external cause status: Secondary | ICD-10-CM | POA: Insufficient documentation

## 2018-07-07 DIAGNOSIS — S42124A Nondisplaced fracture of acromial process, right shoulder, initial encounter for closed fracture: Secondary | ICD-10-CM | POA: Insufficient documentation

## 2018-07-07 DIAGNOSIS — W500XXA Accidental hit or strike by another person, initial encounter: Secondary | ICD-10-CM | POA: Insufficient documentation

## 2018-07-07 DIAGNOSIS — Y9367 Activity, basketball: Secondary | ICD-10-CM | POA: Insufficient documentation

## 2018-07-07 NOTE — ED Triage Notes (Signed)
Pt injured right shoulder while playing basketball. Decreased rom. Possible dislocation.

## 2018-07-08 MED ORDER — HYDROCODONE-ACETAMINOPHEN 5-325 MG PO TABS: 1.0000 | ORAL_TABLET | Freq: Four times a day (QID) | ORAL | 0 refills | Status: DC | PRN

## 2018-07-08 MED ORDER — HYDROCODONE-ACETAMINOPHEN 5-325 MG PO TABS
1.0000 | ORAL_TABLET | Freq: Once | ORAL | Status: AC
  Administered 2018-07-08: 1 via ORAL
  Filled 2018-07-08: qty 1

## 2018-07-08 MED ORDER — IBUPROFEN 400 MG PO TABS
400.0000 mg | ORAL_TABLET | Freq: Once | ORAL | Status: AC | PRN
  Administered 2018-07-08: 400 mg via ORAL
  Filled 2018-07-08: qty 1

## 2018-07-08 NOTE — ED Provider Notes (Signed)
[facility] Provider Note   CSN: [id number] Arrival date & time: 07/07/18  2238     History   Chief Complaint Chief Complaint  Patient presents with  . Shoulder Pain    HPI Donna [name] is a 28 y.o. female.  The history is provided by the patient.  Shoulder Pain  Location:  Shoulder Shoulder location:  R shoulder Injury: yes   Pain details:    Quality:  Aching   Radiates to:  Does not radiate   Severity:  Moderate   Onset quality:  Sudden   Timing:  Constant   Progression:  Worsening Relieved by:  Nothing Worsened by:  Movement Associated symptoms: no back pain and no neck pain    Reports she was playing basketball, going for rebound when 2 other players ran into her.  She reports she heard a "crunch" in her right shoulder.  No falls reported.  She had immediate pain in her right shoulder. No other acute complaints  PMH-none OB History   No obstetric history on file.      Home Medications    Prior to Admission medications   Medication Sig Start Date End Date Taking? Authorizing Provider  HYDROcodone-acetaminophen (NORCO/VICODIN) 5-325 MG tablet Take 1 tablet by mouth every 6 (six) hours as needed for severe pain. 07/08/18   Zadie RhineWickline, Keah Lamba, MD    Family History History reviewed. No pertinent family history.  Social History Social History   Tobacco Use  . Smoking status: Current Every Day Smoker    Packs/day: 0.50    Years: 9.00    Pack years: 4.50    Types: Cigars, Cigarettes  . Smokeless tobacco: Never Used  Substance Use Topics  . Alcohol use: Yes    Alcohol/week: 0.0 standard drinks    Comment: weekends  . Drug use: No     Allergies   Patient has no known allergies.   Review of Systems Review of Systems  Musculoskeletal: Positive for arthralgias. Negative for back pain and neck pain.  Neurological: Negative for headaches.     Physical Exam Updated Vital Signs BP 124/77   Pulse 88   Temp  98.1 F (36.7 C) (Oral)   Resp 16   Ht 1.93 m (6\' 4" )   Wt 80.7 kg   SpO2 100%   BMI 21.67 kg/m   Physical Exam  CONSTITUTIONAL: Well developed/well nourished HEAD: Normocephalic/atraumatic EYES: EOMI ENMT: Mucous membranes moist NECK: supple no meningeal signs CV: S1/S2 noted, no murmurs/rubs/gallops noted LUNGS: Lungs are clear to auscultation bilaterally ABDOMEN: soft, nontender, no rebound or guarding, bowel sounds noted throughout abdomen GU:no cva tenderness NEURO: Pt is awake/alert/appropriate, moves all extremitiesx4.  No facial droop.   EXTREMITIES: pulses normal/equal,  tenderness to palpation of right shoulder.  No deformities.  She is able to abduct the right shoulder but is limited due to pain.  Distal pulses intact.  Full range of motion right elbow/wrist without difficulty. SKIN: warm, color normal PSYCH: no abnormalities of mood noted, alert and oriented to situation  ED Treatments / Results  Labs (all labs ordered are listed, but only abnormal results are displayed) Labs Reviewed - No data to display  EKG None  Radiology Dg Shoulder Right  Result Date: 07/07/2018 CLINICAL DATA:  Right shoulder pain, possible dislocation EXAM: RIGHT SHOULDER - 2+ VIEW COMPARISON:  None. FINDINGS: There is a acute nondisplaced fracture of the base of the acromion. There is no evidence of arthropathy or other focal bone abnormality.  Soft tissues are unremarkable. IMPRESSION: Acute nondisplaced fracture of the base of the acromion. No glenohumeral dislocation. Electronically Signed   By: Elige Ko   On: 07/07/2018 23:33    Procedures Procedures  SPLINT APPLICATION Date/Time: 5:34 AM Authorized by: Joya Gaskins Consent: Verbal consent obtained. Risks and benefits: risks, benefits and alternatives were discussed Consent given by: patient Splint applied by: nurse Location details: sling Splint type: right UE Supplies used: sling Post-procedure: The splinted body  part was neurovascularly unchanged following the procedure. Patient tolerance: Patient tolerated the procedure well with no immediate complications.     Medications Ordered in ED Medications  HYDROcodone-acetaminophen (NORCO/VICODIN) 5-325 MG per tablet 1 tablet (has no administration in time range)  ibuprofen (ADVIL,MOTRIN) tablet 400 mg (400 mg Oral Given 07/08/18 6945)     Initial Impression / Assessment and Plan / ED Course  I have reviewed the triage vital signs and the nursing notes.  Pertinent imaging results that were available during my care of the patient were reviewed by me and considered in my medical decision making (see chart for details).     Patient with nondisplaced fracture of the right acromion Patient placed in sling and referred to orthopedics. Advised ice, rest and sling.  Short course of pain medicines provided  Final Clinical Impressions(s) / ED Diagnoses   Final diagnoses:  Closed nondisplaced fracture of right acromial process, initial encounter    ED Discharge Orders         Ordered    HYDROcodone-acetaminophen (NORCO/VICODIN) 5-325 MG tablet  Every 6 hours PRN     07/08/18 0525           Zadie Rhine, MD 07/08/18 [phone]

## 2018-07-08 NOTE — ED Notes (Signed)
Pain medicines administered and sling applied to right arm.  Sling in position of comfort for patient.

## 2019-05-03 ENCOUNTER — Encounter (HOSPITAL_COMMUNITY): Payer: Self-pay

## 2019-05-03 ENCOUNTER — Emergency Department (HOSPITAL_COMMUNITY)
Admission: EM | Admit: 2019-05-03 | Discharge: 2019-05-03 | Disposition: A | Discharge status: Home / Self Care | Payer: Self-pay | Attending: Emergency Medicine | Admitting: Emergency Medicine

## 2019-05-03 ENCOUNTER — Other Ambulatory Visit: Payer: Self-pay

## 2019-05-03 DIAGNOSIS — E876 Hypokalemia: Secondary | ICD-10-CM | POA: Insufficient documentation

## 2019-05-03 DIAGNOSIS — F17210 Nicotine dependence, cigarettes, uncomplicated: Secondary | ICD-10-CM | POA: Insufficient documentation

## 2019-05-03 DIAGNOSIS — Z79899 Other long term (current) drug therapy: Secondary | ICD-10-CM | POA: Insufficient documentation

## 2019-05-03 DIAGNOSIS — F10920 Alcohol use, unspecified with intoxication, uncomplicated: Secondary | ICD-10-CM | POA: Insufficient documentation

## 2019-05-03 LAB — CBC WITH DIFFERENTIAL/PLATELET
Abs Immature Granulocytes: 0.03 10*3/uL (ref 0.00–0.07)
Basophils Absolute: 0.1 10*3/uL (ref 0.0–0.1)
Basophils Relative: 1 %
Eosinophils Absolute: 0 10*3/uL (ref 0.0–0.5)
Eosinophils Relative: 0 %
HCT: 50.2 % — ABNORMAL HIGH (ref 36.0–46.0)
Hemoglobin: 16.6 g/dL — ABNORMAL HIGH (ref 12.0–15.0)
Immature Granulocytes: 0 %
Lymphocytes Relative: 17 %
Lymphs Abs: 1.8 10*3/uL (ref 0.7–4.0)
MCH: 30.1 pg (ref 26.0–34.0)
MCHC: 33.1 g/dL (ref 30.0–36.0)
MCV: 91.1 fL (ref 80.0–100.0)
Monocytes Absolute: 0.3 10*3/uL (ref 0.1–1.0)
Monocytes Relative: 3 %
Neutro Abs: 8.4 10*3/uL — ABNORMAL HIGH (ref 1.7–7.7)
Neutrophils Relative %: 79 %
Platelets: 191 10*3/uL (ref 150–400)
RBC: 5.51 MIL/uL — ABNORMAL HIGH (ref 3.87–5.11)
RDW: 12.1 % (ref 11.5–15.5)
WBC: 10.6 10*3/uL — ABNORMAL HIGH (ref 4.0–10.5)
nRBC: 0 % (ref 0.0–0.2)

## 2019-05-03 LAB — COMPREHENSIVE METABOLIC PANEL
ALT: 22 U/L (ref 0–44)
AST: 41 U/L (ref 15–41)
Albumin: 5.9 g/dL — ABNORMAL HIGH (ref 3.5–5.0)
Alkaline Phosphatase: 50 U/L (ref 38–126)
Anion gap: 19 — ABNORMAL HIGH (ref 5–15)
BUN: 11 mg/dL (ref 6–20)
CO2: 18 mmol/L — ABNORMAL LOW (ref 22–32)
Calcium: 9.7 mg/dL (ref 8.9–10.3)
Chloride: 106 mmol/L (ref 98–111)
Creatinine, Ser: 0.88 mg/dL (ref 0.44–1.00)
GFR calc Af Amer: >60 mL/min (ref >60)
GFR calc non Af Amer: >60 mL/min (ref >60)
Glucose, Bld: 75 mg/dL (ref 70–99)
Potassium: 2.9 mmol/L — ABNORMAL LOW (ref 3.5–5.1)
Sodium: 143 mmol/L (ref 135–145)
Total Bilirubin: 1 mg/dL (ref 0.3–1.2)
Total Protein: 9.3 g/dL — ABNORMAL HIGH (ref 6.5–8.1)

## 2019-05-03 LAB — RAPID URINE DRUG SCREEN, HOSP PERFORMED
Amphetamines: NOT DETECTED
Barbiturates: NOT DETECTED
Benzodiazepines: POSITIVE — AB
Cocaine: NOT DETECTED
Opiates: NOT DETECTED
Tetrahydrocannabinol: POSITIVE — AB

## 2019-05-03 LAB — I-STAT BETA HCG BLOOD, ED (MC, WL, AP ONLY): I-stat hCG, quantitative: <5 m[IU]/mL (ref <5)

## 2019-05-03 LAB — ETHANOL: Alcohol, Ethyl (B): 121 mg/dL — ABNORMAL HIGH (ref <10)

## 2019-05-03 MED ORDER — POTASSIUM CHLORIDE CRYS ER 20 MEQ PO TBCR
40.0000 meq | EXTENDED_RELEASE_TABLET | Freq: Once | ORAL | Status: AC
  Administered 2019-05-03: 40 meq via ORAL
  Filled 2019-05-03: qty 2

## 2019-05-03 MED ORDER — POTASSIUM CHLORIDE 10 MEQ/100ML IV SOLN: 10.0000 meq | Freq: Once | INTRAVENOUS | Status: DC

## 2019-05-03 MED ORDER — POTASSIUM CHLORIDE CRYS ER 20 MEQ PO TBCR: 20.0000 meq | EXTENDED_RELEASE_TABLET | Freq: Every day | ORAL | 0 refills | Status: DC

## 2019-05-03 MED ORDER — ONDANSETRON HCL 4 MG/2ML IJ SOLN
4.0000 mg | Freq: Once | INTRAMUSCULAR | Status: AC
Start: 2019-05-03 — End: 2019-05-03
  Administered 2019-05-03: 4 mg via INTRAVENOUS
  Filled 2019-05-03: qty 2

## 2019-05-03 NOTE — ED Triage Notes (Signed)
Patient arrived via [facility] with [employer] escort from home.Patient girlfriend called 911. Patient stated she drank 1 bottle due to being sad and depressed due to family member going to jail. Patient attempted to get in front of EMS truck after denying aid and then took off on foot. [employer] has arrested patient due to patient being a threat to herself and others.   EMS states patient has periods of patient being AOx4 and then falls asleep and then wakes up being either extremely happy and thankful or extremely violent which patient has been.   5mg  of Midazolam given left deltoid around 1600.

## 2019-05-03 NOTE — ED Notes (Signed)
Patient IVC Paperwork is signed and placed in orange folder at Nurse station [facility] 16-18 section.

## 2019-05-03 NOTE — ED Notes (Signed)
Patient is cooperating with staff, has dressed out and has agreed to let staff draw blood. RN has relayed information to ED Provider and ED Provider agrees that not that patient has sitter, is changed out with items secure, and is being calm and compliant that patient does not need to be put in violent/aggresive restraints.

## 2019-05-03 NOTE — ED Provider Notes (Addendum)
[facility]-EMERGENCY DEPT Provider Note   CSN: [id number] Arrival date & time: 05/03/19  1631     History   Chief Complaint Chief Complaint  Patient presents with  . Aggressive Behavior  . Alcohol Intoxication    HPI Donna [name] is a 28 y.o. female.     Patient is a 28 year old female who [facility] combative behavior and alcohol intoxication.  Patient states that she drank a whole bottle of alcohol today.  She says that she is tired of being sad and she uses it as a coping mechanism.  She was combative during EMS care and was given 5 mg of midazolam.  She denies any drug use other than occasional marijuana use.  She denies any fevers or other recent illnesses.  History is limited due to her intoxication.     History reviewed. No pertinent past medical history.  There are no active problems to display for this patient.   History reviewed. No pertinent surgical history.   OB History   No obstetric history on file.      Home Medications    Prior to Admission medications   Medication Sig Start Date End Date Taking? Authorizing Provider  HYDROcodone-acetaminophen (NORCO/VICODIN) 5-325 MG tablet Take 1 tablet by mouth every 6 (six) hours as needed for severe pain. Patient not taking: Reported on 05/03/2019 07/08/18   Zadie RhineWickline, Donald, MD  potassium chloride SA (KLOR-CON) 20 MEQ tablet Take 1 tablet (20 mEq total) by mouth daily. 05/03/19   Rolan BuccoBelfi, Navreet Bolda, MD    Family History No family history on file.  Social History Social History   Tobacco Use  . Smoking status: Current Every Day Smoker    Packs/day: 0.50    Years: 9.00    Pack years: 4.50    Types: Cigars, Cigarettes  . Smokeless tobacco: Never Used  Substance Use Topics  . Alcohol use: Yes    Alcohol/week: 0.0 standard drinks    Comment: weekends  . Drug use: No     Allergies   Patient has no known allergies.   Review of Systems Review of  Systems  Constitutional: Negative for chills, diaphoresis, fatigue and fever.  HENT: Negative for congestion, rhinorrhea and sneezing.   Eyes: Negative.   Respiratory: Negative for cough, chest tightness and shortness of breath.   Cardiovascular: Negative for chest pain and leg swelling.  Gastrointestinal: Negative for abdominal pain, blood in stool, diarrhea, nausea and vomiting.  Genitourinary: Negative for difficulty urinating, flank pain, frequency and hematuria.  Musculoskeletal: Negative for arthralgias and back pain.  Skin: Negative for rash.  Neurological: Negative for dizziness, speech difficulty, weakness, numbness and headaches.  Psychiatric/Behavioral: Positive for dysphoric mood.     Physical Exam Updated Vital Signs BP 120/83   Pulse 84   Temp 97.7 F (36.5 C) (Oral)   Resp 20   Ht 6\' 4"  (1.93 m)   Wt 80.7 kg   SpO2 99%   BMI 21.66 kg/m   Physical Exam Constitutional:      Appearance: She is well-developed.  HENT:     Head: Normocephalic and atraumatic.  Eyes:     Pupils: Pupils are equal, round, and reactive to light.  Neck:     Musculoskeletal: Normal range of motion and neck supple.  Cardiovascular:     Rate and Rhythm: Normal rate and regular rhythm.     Heart sounds: Normal heart sounds.  Pulmonary:     Effort: Pulmonary effort is normal.  No respiratory distress.     Breath sounds: Normal breath sounds. No wheezing or rales.  Chest:     Chest wall: No tenderness.  Abdominal:     General: Bowel sounds are normal.     Palpations: Abdomen is soft.     Tenderness: There is no abdominal tenderness. There is no guarding or rebound.  Musculoskeletal: Normal range of motion.  Lymphadenopathy:     Cervical: No cervical adenopathy.  Skin:    General: Skin is warm and dry.     Findings: No rash.  Neurological:     Mental Status: She is alert and oriented to person, place, and time.  Psychiatric:     Comments: Patient is tearful on exam and keeps  talking about how she is sorry for her behavior and just wants her sadness to stop.      ED Treatments / Results  Labs (all labs ordered are listed, but only abnormal results are displayed) Labs Reviewed  COMPREHENSIVE METABOLIC PANEL - Abnormal; Notable for the following components:      Result Value   Potassium 2.9 (*)    CO2 18 (*)    Total Protein 9.3 (*)    Albumin 5.9 (*)    Anion gap 19 (*)    All other components within normal limits  ETHANOL - Abnormal; Notable for the following components:   Alcohol, Ethyl (B) 121 (*)    All other components within normal limits  RAPID URINE DRUG SCREEN, HOSP PERFORMED - Abnormal; Notable for the following components:   Benzodiazepines POSITIVE (*)    Tetrahydrocannabinol POSITIVE (*)    All other components within normal limits  CBC WITH DIFFERENTIAL/PLATELET - Abnormal; Notable for the following components:   WBC 10.6 (*)    RBC 5.51 (*)    Hemoglobin 16.6 (*)    HCT 50.2 (*)    Neutro Abs 8.4 (*)    All other components within normal limits  I-STAT BETA HCG BLOOD, ED ([facility], [facility], [facility] ONLY)    EKG None  Radiology No results found.  Procedures Procedures (including critical care time)  Medications Ordered in ED Medications  potassium chloride SA (KLOR-CON) CR tablet 40 mEq (has no administration in time range)  ondansetron (ZOFRAN) injection 4 mg (4 mg Intravenous Given 05/03/19 2028)     Initial Impression / Assessment and Plan / ED Course  I have reviewed the triage vital signs and the nursing notes.  Pertinent labs & imaging results that were available during my care of the patient were reviewed by me and considered in my medical decision making (see chart for details).        Patient is a 28 year old female who presents with alcohol intoxication.  She became aggressive and at one point tried to leave.  IVC papers were initiated because when she initially arrived she was crying and saying that she was drinking to  relieve the pain.  She was monitored here in the ED for several hours and following this, she is feeling much better.  She says she has had a lot of stress taking care of her sister who is currently in jail.  She denies any thoughts of wanting to hurt herself.  She says she would never hurt herself because her sister is depending on her.  She does not want to talk to a counselor today.  She is fully alert and oriented.  I will give her resources for possible outpatient counseling.  Her potassium was found to  be low.  She says that she has been told that her potassium is always low since she was a kid.  She was given dose of potassium in the ED and I will give her prescription for a 3-day supply.  I did stress that it is important to have her potassium rechecked which she can potentially do in urgent care.  She says that she does not drink alcohol regularly and that this was an unusual situation for her because she became a little bit more stressed today.  She again adamantly denies any thoughts of wanting to hurt herself.  She was discharged home in good condition.  Return precautions were given.  Her IVC was rescinded.  Final Clinical Impressions(s) / ED Diagnoses   Final diagnoses:  Alcoholic intoxication without complication ([place])  Hypokalemia    ED Discharge Orders         Ordered    potassium chloride SA (KLOR-CON) 20 MEQ tablet  Daily     05/03/19 2211           Malvin Johns, MD 05/03/19 2214    Malvin Johns, MD 05/03/19 2216

## 2019-05-03 NOTE — ED Notes (Signed)
Patient has ambulated to restroom without complication.

## 2019-05-03 NOTE — ED Notes (Signed)
Patient's belongings are in one white belonging's bag and placed in the cabinet labeled "patient belongings 16-18 Resus A."

## 2019-05-03 NOTE — ED Notes (Signed)
Sitter at bedside at this time.  Patient has been dressed out and items bagged and tagged and put in cabinets over nurse station 22-19.

## 2019-05-03 NOTE — ED Notes (Signed)
RN has spoken with [employer], and [employer] has spoken with family. Neither will be IVC'ing patient and RN has spoken to ED Provider and patient will not be IVC'ed at this point unless patient attempts to leave or becomes aggressive with staff per ED Provider, at that point patient will be IVC'ed.

## 2019-05-03 NOTE — ED Notes (Signed)
MD stated pt was no longer under IVC, MD stated pt is ready for discharge after oral potassium, informed IV potasium was discontinued.

## 2019-05-03 NOTE — ED Notes (Signed)
RN and NT removed restraints for patient to ambulate to restroom and patient was putting on clothes attempting to leave. Charge RN informed ED provider. ED Provider is now IVC'ing patient.

## 2019-05-03 NOTE — ED Notes (Signed)
Refuse blood to be drawn

## 2019-05-03 NOTE — Discharge Instructions (Addendum)
Take your potassium tablets as directed.  You need to have your potassium rechecked within the next few days.  This can be done in urgent care.  Refrain from alcohol use.  If you have any worsening depression or thoughts of wanting to harm yourself, you need to return to the emergency room.  Return here as needed for any other worsening symptoms.

## 2019-08-09 ENCOUNTER — Emergency Department (HOSPITAL_COMMUNITY): Payer: Self-pay

## 2019-08-09 ENCOUNTER — Other Ambulatory Visit: Payer: Self-pay

## 2019-08-09 ENCOUNTER — Encounter (HOSPITAL_COMMUNITY): Payer: Self-pay | Admitting: Pharmacy Technician

## 2019-08-09 ENCOUNTER — Emergency Department (HOSPITAL_COMMUNITY)
Admission: EM | Admit: 2019-08-09 | Discharge: 2019-08-09 | Disposition: A | Discharge status: Home / Self Care | Payer: Self-pay | Attending: Emergency Medicine | Admitting: Emergency Medicine

## 2019-08-09 DIAGNOSIS — Z79899 Other long term (current) drug therapy: Secondary | ICD-10-CM | POA: Insufficient documentation

## 2019-08-09 DIAGNOSIS — R079 Chest pain, unspecified: Secondary | ICD-10-CM | POA: Insufficient documentation

## 2019-08-09 DIAGNOSIS — F17210 Nicotine dependence, cigarettes, uncomplicated: Secondary | ICD-10-CM | POA: Insufficient documentation

## 2019-08-09 LAB — TROPONIN I (HIGH SENSITIVITY)
Troponin I (High Sensitivity): <2 ng/L (ref <18)
Troponin I (High Sensitivity): 3 ng/L (ref <18)

## 2019-08-09 LAB — CBC
HCT: 45 % (ref 36.0–46.0)
Hemoglobin: 14.9 g/dL (ref 12.0–15.0)
MCH: 29.7 pg (ref 26.0–34.0)
MCHC: 33.1 g/dL (ref 30.0–36.0)
MCV: 89.6 fL (ref 80.0–100.0)
Platelets: 172 10*3/uL (ref 150–400)
RBC: 5.02 MIL/uL (ref 3.87–5.11)
RDW: 12.1 % (ref 11.5–15.5)
WBC: 4.8 10*3/uL (ref 4.0–10.5)
nRBC: 0 % (ref 0.0–0.2)

## 2019-08-09 LAB — BASIC METABOLIC PANEL
Anion gap: 10 (ref 5–15)
BUN: 9 mg/dL (ref 6–20)
CO2: 24 mmol/L (ref 22–32)
Calcium: 9.7 mg/dL (ref 8.9–10.3)
Chloride: 106 mmol/L (ref 98–111)
Creatinine, Ser: 0.99 mg/dL (ref 0.44–1.00)
GFR calc Af Amer: >60 mL/min (ref >60)
GFR calc non Af Amer: >60 mL/min (ref >60)
Glucose, Bld: 89 mg/dL (ref 70–99)
Potassium: 4.1 mmol/L (ref 3.5–5.1)
Sodium: 140 mmol/L (ref 135–145)

## 2019-08-09 LAB — I-STAT BETA HCG BLOOD, ED (MC, WL, AP ONLY): I-stat hCG, quantitative: <5 m[IU]/mL (ref <5)

## 2019-08-09 LAB — D-DIMER, QUANTITATIVE: D-Dimer, Quant: <0.27 ug/mL-FEU (ref 0.00–0.50)

## 2019-08-09 MED ORDER — SODIUM CHLORIDE 0.9% FLUSH: 3.0000 mL | Freq: Once | INTRAVENOUS | Status: DC

## 2019-08-09 NOTE — ED Triage Notes (Signed)
Pt arrives pov with sudden onset L sided chest pain 30 min pta with associated diaphoresis.

## 2019-08-09 NOTE — ED Provider Notes (Signed)
[facility] EMERGENCY DEPARTMENT Provider Note   CSN: [id number] Arrival date & time: 08/09/19  1739     History Chief Complaint  Patient presents with  . Chest Pain    Donna [name] is a 29 y.o. female.  Patient with no significant medical history, cigarette smoker presents after episode of chest pain that occurred 30 minutes prior to arrival.  Sudden onset left lower anterior nonradiating.  No cough fevers or chills.  No blood clot history or cardiac history.  No recent surgeries or leg swelling.  Currently no significant symptoms have resolved.  Patient one brief episode of this in the past.  No exertional symptoms.        History reviewed. No pertinent past medical history.  There are no problems to display for this patient.   History reviewed. No pertinent surgical history.   OB History   No obstetric history on file.     No family history on file.  Social History   Tobacco Use  . Smoking status: Current Every Day Smoker    Packs/day: 0.50    Years: 9.00    Pack years: 4.50    Types: Cigars, Cigarettes  . Smokeless tobacco: Never Used  Substance Use Topics  . Alcohol use: Yes    Alcohol/week: 0.0 standard drinks    Comment: weekends  . Drug use: No    Home Medications Prior to Admission medications   Medication Sig Start Date End Date Taking? Authorizing Provider  HYDROcodone-acetaminophen (NORCO/VICODIN) 5-325 MG tablet Take 1 tablet by mouth every 6 (six) hours as needed for severe pain. Patient not taking: Reported on 05/03/2019 07/08/18   Ripley Fraise, MD  potassium chloride SA (KLOR-CON) 20 MEQ tablet Take 1 tablet (20 mEq total) by mouth daily. 05/03/19   Malvin Johns, MD    Allergies    Patient has no known allergies.  Review of Systems   Review of Systems  Constitutional: Positive for diaphoresis. Negative for chills and fever.  HENT: Negative for congestion.   Eyes: Negative for visual disturbance.   Respiratory: Negative for shortness of breath.   Cardiovascular: Positive for chest pain. Negative for leg swelling.  Gastrointestinal: Negative for abdominal pain and vomiting.  Genitourinary: Negative for dysuria and flank pain.  Musculoskeletal: Negative for back pain, neck pain and neck stiffness.  Skin: Negative for rash.  Neurological: Negative for light-headedness and headaches.    Physical Exam Updated Vital Signs BP 114/72   Pulse (!) 54   Temp 97.7 F (36.5 C) (Oral)   Resp 18   Ht 6\' 4"  (1.93 m)   Wt 79.4 kg   SpO2 100%   BMI 21.30 kg/m   Physical Exam Vitals and nursing note reviewed.  Constitutional:      Appearance: She is well-developed.  HENT:     Head: Normocephalic and atraumatic.  Eyes:     General:        Right eye: No discharge.        Left eye: No discharge.     Conjunctiva/sclera: Conjunctivae normal.  Neck:     Trachea: No tracheal deviation.  Cardiovascular:     Rate and Rhythm: Regular rhythm. Tachycardia present.  Pulmonary:     Effort: Pulmonary effort is normal.     Breath sounds: Normal breath sounds.  Abdominal:     General: There is no distension.     Palpations: Abdomen is soft.     Tenderness: There is no abdominal tenderness. There is  no guarding.  Musculoskeletal:     Cervical back: Normal range of motion and neck supple.  Skin:    General: Skin is warm.     Findings: No rash.  Neurological:     Mental Status: She is alert and oriented to person, place, and time.     ED Results / Procedures / Treatments   Labs (all labs ordered are listed, but only abnormal results are displayed) Labs Reviewed  BASIC METABOLIC PANEL  CBC  D-DIMER, QUANTITATIVE (NOT AT [facility])  I-STAT BETA HCG BLOOD, ED ([facility], [facility], [facility] ONLY)  TROPONIN I (HIGH SENSITIVITY)  TROPONIN I (HIGH SENSITIVITY)    EKG EKG Interpretation  Date/Time:  Sunday August 09 2019 17:43:37 EST Ventricular Rate:  82 PR Interval:  150 QRS Duration: 80 QT Interval:  372  QTC Calculation: 434 R Axis:   90 Text Interpretation: Normal sinus rhythm with sinus arrhythmia Rightward axis Borderline ECG Confirmed by Blane Ohara [phone]) on 08/09/2019 6:18:08 PM   Radiology DG Chest Portable 1 View  Result Date: 08/09/2019 CLINICAL DATA:  Acute chest pain beginning today. EXAM: PORTABLE CHEST 1 VIEW COMPARISON:  None. FINDINGS: The heart size and mediastinal contours are within normal limits. Both lungs are clear. The visualized skeletal structures are unremarkable. IMPRESSION: No active disease. Electronically Signed   By: Danae Orleans M.D.   On: 08/09/2019 20:05    Procedures Procedures (including critical care time)  Medications Ordered in ED Medications  sodium chloride flush (NS) 0.9 % injection 3 mL (3 mLs Intravenous Not Given 08/09/19 1906)    ED Course  I have reviewed the triage vital signs and the nursing notes.  Pertinent labs & imaging results that were available during my care of the patient were reviewed by me and considered in my medical decision making (see chart for details).    MDM Rules/Calculators/A&P                     Patient presents after episode of sudden onset chest pain.  Patient is low risk for blood clots, D-dimer sent with sinus tachycardia and patient very low risk for cardiac plan for 2 troponins.  Chest x-ray ordered and reviewed no acute findings.  Blood work ordered and reviewed hemoglobin normal, first troponin negative.  Patient has no symptoms on reassessment.  Second troponin negative D-dimer negative.  Patient stable for outpatient follow-up. Final Clinical Impression(s) / ED Diagnoses Final diagnoses:  Acute chest pain    Rx / DC Orders ED Discharge Orders    None       Blane Ohara, MD 08/10/19 0001

## 2019-08-09 NOTE — Discharge Instructions (Signed)
Follow-up with local clinician.  Return for recurrent chest pain especially if it is with exertion, does not resolve, shortness of breath or passing out with it or new concerns.

## 2019-08-09 NOTE — ED Notes (Signed)
Patient verbalizes understanding of discharge instructions. Opportunity for questioning and answers were provided. Armband removed by staff, pt discharged from ED.  

## 2019-09-29 ENCOUNTER — Other Ambulatory Visit: Payer: Self-pay

## 2019-09-29 ENCOUNTER — Ambulatory Visit: Payer: Medicaid Other | Admitting: Orthopaedic Surgery

## 2019-10-06 ENCOUNTER — Other Ambulatory Visit: Payer: Self-pay

## 2019-10-06 ENCOUNTER — Encounter: Payer: Self-pay | Admitting: Orthopaedic Surgery

## 2019-10-06 ENCOUNTER — Ambulatory Visit (INDEPENDENT_AMBULATORY_CARE_PROVIDER_SITE_OTHER): Payer: 59

## 2019-10-06 ENCOUNTER — Ambulatory Visit (INDEPENDENT_AMBULATORY_CARE_PROVIDER_SITE_OTHER): Payer: 59 | Admitting: Orthopaedic Surgery

## 2019-10-06 VITALS — Ht 76.0 in | Wt 175.0 lb

## 2019-10-06 DIAGNOSIS — G8929 Other chronic pain: Secondary | ICD-10-CM

## 2019-10-06 DIAGNOSIS — M25511 Pain in right shoulder: Secondary | ICD-10-CM

## 2019-10-06 DIAGNOSIS — M25561 Pain in right knee: Secondary | ICD-10-CM

## 2019-10-06 MED ORDER — BUPIVACAINE HCL 0.5 % IJ SOLN
2.0000 mL | INTRAMUSCULAR | Status: AC | PRN
  Administered 2019-10-06: 2 mL via INTRA_ARTICULAR

## 2019-10-06 MED ORDER — LIDOCAINE HCL 1 % IJ SOLN
2.0000 mL | INTRAMUSCULAR | Status: AC | PRN
  Administered 2019-10-06: 2 mL

## 2019-10-06 MED ORDER — METHYLPREDNISOLONE ACETATE 40 MG/ML IJ SUSP
40.0000 mg | INTRAMUSCULAR | Status: AC | PRN
  Administered 2019-10-06: 40 mg via INTRA_ARTICULAR

## 2019-10-06 NOTE — Progress Notes (Signed)
Office Visit Note   Patient: Donna [name]           Date of Birth: 11-29-1990           MRN: [id number] Visit Date: 10/06/2019              Requested by: No referring provider defined for this encounter. PCP: Patient, No Pcp Per   Assessment & Plan: Visit Diagnoses:  1. Chronic pain of right knee   2. Acute pain of right shoulder     Plan: Impression is mildly symptomatic right AC joint pain due to previous fracture.  Mainly this is all that bothersome and she just treats it symptomatically.  The main issue is the right knee.  I aspirated 35 cc of joint fluid and then injected cortisone.  Given the history of a lateral meniscal tear and pain at the lateral joint line we will need to obtain a new MRI to fully assess for structural abnormalities.  Follow-up after the MRI.  Follow-Up Instructions: Return for 10-14 days.   Orders:  Orders Placed This Encounter  Procedures  . XR KNEE 3 VIEW RIGHT  . XR Shoulder Right  . MR Knee Right w/o contrast  . Cell count + diff,  w/ cryst-synvl fld   No orders of the defined types were placed in this encounter.     Procedures: Large Joint Inj: R knee on 10/06/2019 5:11 PM Indications: pain Details: 22 G needle  Arthrogram: No  Medications: 40 mg methylPREDNISolone acetate 40 MG/ML; 2 mL lidocaine 1 %; 2 mL bupivacaine 0.5 % Consent was given by the patient. Patient was prepped and draped in the usual sterile fashion.       Clinical Data: No additional findings.   Subjective: Chief Complaint  Patient presents with  . Right Shoulder - Pain  . Right Knee - Pain    Donna [name] is a 29 year old female comes in for evaluation of right shoulder and right knee pain.  The right knee pain is more significant.  Onset of pain was 3 days ago without any apparent or definite injuries or trauma.  She has had history of swelling in her right knee that usually resolves with symptomatic treatment and time but this time the swelling is  persistent.  She was in the military years ago and was diagnosed with a lateral meniscal tear based on MRI but this is not available for review.  She does work out and this causes her right knee to experience catching or locking.  Her right shoulder complaint is that she has a slightly prominent AC joint.  She states that she had a fracture 2 years ago which did not require surgery.  She occasionally has pain in that area but it comes and goes.  Denies any numbness and tingling or radicular symptoms.   Review of Systems  Constitutional: Negative.   HENT: Negative.   Eyes: Negative.   Respiratory: Negative.   Cardiovascular: Negative.   Endocrine: Negative.   Musculoskeletal: Negative.   Neurological: Negative.   Hematological: Negative.   Psychiatric/Behavioral: Negative.   All other systems reviewed and are negative.    Objective: Vital Signs: Ht 6\' 4"  (1.93 m)   Wt 175 lb (79.4 kg)   BMI 21.30 kg/m   Physical Exam Vitals and nursing note reviewed.  Constitutional:      Appearance: She is well-developed.  HENT:     Head: Normocephalic and atraumatic.  Pulmonary:     Effort: Pulmonary effort is  normal.  Abdominal:     Palpations: Abdomen is soft.  Musculoskeletal:     Cervical back: Neck supple.  Skin:    General: Skin is warm.     Capillary Refill: Capillary refill takes less than 2 seconds.  Neurological:     Mental Status: She is alert and oriented to person, place, and time.  Psychiatric:        Behavior: Behavior normal.        Thought Content: Thought content normal.        Judgment: Judgment normal.     Ortho Exam Right shoulder shows full range of motion without pain.  Manual muscle testing is normal.  No significant asymmetry in terms of AC joint appearance or to palpation.  Negative cross body adduction. Right knee shows a large joint effusion.  Limited range of motion as a result.  Collaterals and cruciates are grossly intact.  Lateral joint line  tenderness.  Positive McMurray at the joint line Specialty Comments:  No specialty comments available.  Imaging: XR KNEE 3 VIEW RIGHT  Result Date: 10/06/2019 Periarticular spurring of the lateral compartment  XR Shoulder Right  Result Date: 10/06/2019 No acute or structural abnormalities    PMFS History: There are no problems to display for this patient.  No past medical history on file.  No family history on file.  No past surgical history on file. Social History   Occupational History  . Not on file  Tobacco Use  . Smoking status: Current Every Day Smoker    Packs/day: 0.50    Years: 9.00    Pack years: 4.50    Types: Cigars, Cigarettes  . Smokeless tobacco: Never Used  Substance and Sexual Activity  . Alcohol use: Yes    Alcohol/week: 0.0 standard drinks    Comment: weekends  . Drug use: No  . Sexual activity: Not Currently    Birth control/protection: None

## 2019-10-07 ENCOUNTER — Other Ambulatory Visit: Payer: Self-pay | Admitting: Physician Assistant

## 2019-10-07 ENCOUNTER — Telehealth: Payer: Self-pay | Admitting: Orthopaedic Surgery

## 2019-10-07 LAB — SYNOVIAL CELL COUNT + DIFF, W/ CRYSTALS
Basophils, %: 0 %
Eosinophils-Synovial: 0 % (ref 0–2)
Lymphocytes-Synovial Fld: 19 % (ref 0–74)
Monocyte/Macrophage: 77 % — ABNORMAL HIGH (ref 0–69)
Neutrophil, Synovial: 0 % (ref 0–24)
Synoviocytes, %: 4 % (ref 0–15)
WBC, Synovial: 187 cells/uL — ABNORMAL HIGH (ref <150)

## 2019-10-07 LAB — TIQ-NTM

## 2019-10-07 MED ORDER — TRAMADOL HCL 50 MG PO TABS: 50.0000 mg | ORAL_TABLET | Freq: Three times a day (TID) | ORAL | 0 refills | Status: DC | PRN

## 2019-10-07 NOTE — Progress Notes (Unsigned)
tra

## 2019-10-07 NOTE — Telephone Encounter (Signed)
Patient called requesting pain medication. Patient states she has tried over the counter medications and nothing is touching the pain. Patient states she is unable to sleep Please send to pharmacy [facility] [address]. Patient phone number is [phone]

## 2019-10-07 NOTE — Telephone Encounter (Signed)
Just sent in

## 2019-10-08 ENCOUNTER — Other Ambulatory Visit: Payer: Self-pay

## 2019-10-08 MED ORDER — TRAMADOL HCL 50 MG PO TABS: 50.0000 mg | ORAL_TABLET | Freq: Three times a day (TID) | ORAL | 0 refills | Status: DC | PRN

## 2019-10-08 NOTE — Telephone Encounter (Signed)
Donna [name] sent this to wrong pharm. Called Rx into [facility]. Patient aware Rx at pharm.

## 2019-10-20 ENCOUNTER — Ambulatory Visit: Payer: 59 | Admitting: Orthopaedic Surgery

## 2019-10-25 ENCOUNTER — Other Ambulatory Visit: Payer: 59

## 2019-10-27 ENCOUNTER — Ambulatory Visit: Payer: 59 | Admitting: Orthopaedic Surgery

## 2019-11-19 ENCOUNTER — Telehealth: Payer: Self-pay | Admitting: Orthopaedic Surgery

## 2019-11-19 NOTE — Telephone Encounter (Signed)
Could either of you please help with this?

## 2019-11-19 NOTE — Telephone Encounter (Signed)
Pt called wanting to know if she could get her MRI done elsewhere due to her going out of town and [facility] not having anything sooner than 11/24/19.   [phone]

## 2019-11-19 NOTE — Telephone Encounter (Signed)
Called and spoke with patient, confirmed patient is leaving town on 6/23 returning on 7/1.  I explained to patient due to her insurance we would have to resubmit authorization request due to facility change, which could take another 24-48hrs to get approval. Patient stated she would keep her approval with [facility] and reschedule after 7/1.

## 2019-11-19 NOTE — Telephone Encounter (Signed)
noted 

## 2019-11-24 ENCOUNTER — Other Ambulatory Visit: Payer: 59

## 2019-12-04 ENCOUNTER — Ambulatory Visit
Admission: RE | Admit: 2019-12-04 | Discharge: 2019-12-04 | Disposition: A | Discharge status: Home / Self Care | Payer: 59 | Source: Ambulatory Visit | Attending: Orthopaedic Surgery | Admitting: Orthopaedic Surgery

## 2019-12-04 ENCOUNTER — Ambulatory Visit: Payer: 59 | Admitting: Orthopaedic Surgery

## 2019-12-04 ENCOUNTER — Other Ambulatory Visit: Payer: Self-pay

## 2019-12-04 DIAGNOSIS — M25561 Pain in right knee: Secondary | ICD-10-CM

## 2019-12-07 NOTE — Progress Notes (Signed)
Needs f/u appt.  Thanks.

## 2019-12-15 ENCOUNTER — Ambulatory Visit (INDEPENDENT_AMBULATORY_CARE_PROVIDER_SITE_OTHER): Payer: 59 | Admitting: Orthopaedic Surgery

## 2019-12-15 ENCOUNTER — Encounter: Payer: Self-pay | Admitting: Orthopaedic Surgery

## 2019-12-15 VITALS — Ht 76.0 in | Wt 166.0 lb

## 2019-12-15 DIAGNOSIS — S83281A Other tear of lateral meniscus, current injury, right knee, initial encounter: Secondary | ICD-10-CM | POA: Diagnosis not present

## 2019-12-15 NOTE — Progress Notes (Signed)
° °  Office Visit Note   Patient: Donna [name]           Date of Birth: [birth year]           MRN: [id number] Visit Date: 12/15/2019              Requested by: No referring provider defined for this encounter. PCP: Patient, No Pcp Per   Assessment & Plan: Visit Diagnoses:  1. Acute lateral meniscus tear of right knee, initial encounter     Plan: MRI shows small undersurface lateral meniscus tear and progression of lateral compartment chondromalacia.  Overall her symptoms are mild and not having mechanical symptoms therefore we will hold off on any invasive treatments for now.  She will follow-up with me as needed.  She will steadily increase activity as tolerated.  Follow-Up Instructions: Return if symptoms worsen or fail to improve.   Orders:  No orders of the defined types were placed in this encounter.  No orders of the defined types were placed in this encounter.     Procedures: No procedures performed   Clinical Data: No additional findings.   Subjective: Chief Complaint  Patient presents with   Right Knee - Follow-up    MRI review    Truddie Hidden returns today for MRI review.  The pain comes and goes.  Overall she pain is reasonable.   Review of Systems  Constitutional: Negative.   HENT: Negative.   Eyes: Negative.   Respiratory: Negative.   Cardiovascular: Negative.   Endocrine: Negative.   Musculoskeletal: Negative.   Neurological: Negative.   Hematological: Negative.   Psychiatric/Behavioral: Negative.   All other systems reviewed and are negative.    Objective: Vital Signs: Ht 6\' 4"  (1.93 m)    Wt 166 lb (75.3 kg)    BMI 20.21 kg/m   Physical Exam Vitals and nursing note reviewed.  Constitutional:      Appearance: She is well-developed.  Pulmonary:     Effort: Pulmonary effort is normal.  Skin:    General: Skin is warm.     Capillary Refill: Capillary refill takes less than 2 seconds.  Neurological:     Mental Status: She is alert and  oriented to person, place, and time.  Psychiatric:        Behavior: Behavior normal.        Thought Content: Thought content normal.        Judgment: Judgment normal.     Ortho Exam Right knee exam is unchanged.  Trace effusion.  No significant lateral joint line tenderness. Specialty Comments:  No specialty comments available.  Imaging: No results found.   PMFS History: Patient Active Problem List   Diagnosis Date Noted   Acute lateral meniscus tear of right knee 12/15/2019   History reviewed. No pertinent past medical history.  History reviewed. No pertinent family history.  History reviewed. No pertinent surgical history. Social History   Occupational History   Not on file  Tobacco Use   Smoking status: Current Every Day Smoker    Packs/day: 0.50    Years: 9.00    Pack years: 4.50    Types: Cigars, Cigarettes   Smokeless tobacco: Never Used  Substance and Sexual Activity   Alcohol use: Yes    Alcohol/week: 0.0 standard drinks    Comment: weekends   Drug use: No   Sexual activity: Not Currently    Birth control/protection: None

## 2020-10-10 ENCOUNTER — Encounter (HOSPITAL_COMMUNITY): Payer: Self-pay

## 2020-10-10 ENCOUNTER — Emergency Department (HOSPITAL_COMMUNITY)
Admission: EM | Admit: 2020-10-10 | Discharge: 2020-10-10 | Disposition: A | Discharge status: Home / Self Care | Payer: 59 | Attending: Emergency Medicine | Admitting: Emergency Medicine

## 2020-10-10 ENCOUNTER — Emergency Department (HOSPITAL_COMMUNITY): Payer: 59

## 2020-10-10 DIAGNOSIS — S01512A Laceration without foreign body of oral cavity, initial encounter: Secondary | ICD-10-CM

## 2020-10-10 DIAGNOSIS — S0990XA Unspecified injury of head, initial encounter: Secondary | ICD-10-CM | POA: Diagnosis present

## 2020-10-10 DIAGNOSIS — F17210 Nicotine dependence, cigarettes, uncomplicated: Secondary | ICD-10-CM | POA: Insufficient documentation

## 2020-10-10 DIAGNOSIS — S0083XA Contusion of other part of head, initial encounter: Secondary | ICD-10-CM

## 2020-10-10 LAB — BASIC METABOLIC PANEL
Anion gap: 7 (ref 5–15)
BUN: 6 mg/dL (ref 6–20)
CO2: 27 mmol/L (ref 22–32)
Calcium: 8.8 mg/dL — ABNORMAL LOW (ref 8.9–10.3)
Chloride: 102 mmol/L (ref 98–111)
Creatinine, Ser: 0.93 mg/dL (ref 0.44–1.00)
GFR, Estimated: >60 mL/min (ref >60)
Glucose, Bld: 105 mg/dL — ABNORMAL HIGH (ref 70–99)
Potassium: 3.6 mmol/L (ref 3.5–5.1)
Sodium: 136 mmol/L (ref 135–145)

## 2020-10-10 LAB — CBC
HCT: 42.4 % (ref 36.0–46.0)
Hemoglobin: 14.1 g/dL (ref 12.0–15.0)
MCH: 30.1 pg (ref 26.0–34.0)
MCHC: 33.3 g/dL (ref 30.0–36.0)
MCV: 90.4 fL (ref 80.0–100.0)
Platelets: 167 10*3/uL (ref 150–400)
RBC: 4.69 MIL/uL (ref 3.87–5.11)
RDW: 12.3 % (ref 11.5–15.5)
WBC: 8.6 10*3/uL (ref 4.0–10.5)
nRBC: 0 % (ref 0.0–0.2)

## 2020-10-10 LAB — I-STAT BETA HCG BLOOD, ED (MC, WL, AP ONLY): I-stat hCG, quantitative: <5 m[IU]/mL (ref <5)

## 2020-10-10 LAB — ETHANOL: Alcohol, Ethyl (B): <10 mg/dL (ref <10)

## 2020-10-10 MED ORDER — ONDANSETRON HCL 4 MG/2ML IJ SOLN
4.0000 mg | Freq: Once | INTRAMUSCULAR | Status: AC
  Administered 2020-10-10: 4 mg via INTRAVENOUS
  Filled 2020-10-10: qty 2

## 2020-10-10 MED ORDER — HYDROMORPHONE HCL 1 MG/ML IJ SOLN
1.0000 mg | Freq: Once | INTRAMUSCULAR | Status: AC
  Administered 2020-10-10: 1 mg via INTRAVENOUS
  Filled 2020-10-10: qty 1

## 2020-10-10 MED ORDER — HYDROCODONE-ACETAMINOPHEN 5-325 MG PO TABS: 1.0000 | ORAL_TABLET | ORAL | 0 refills | Status: DC | PRN

## 2020-10-10 MED ORDER — LIDOCAINE VISCOUS HCL 2 % MT SOLN
15.0000 mL | Freq: Once | OROMUCOSAL | Status: AC
  Administered 2020-10-10: 15 mL via OROMUCOSAL
  Filled 2020-10-10: qty 15

## 2020-10-10 MED ORDER — AMOXICILLIN 500 MG PO CAPS: 500.0000 mg | ORAL_CAPSULE | Freq: Three times a day (TID) | ORAL | 0 refills | Status: DC

## 2020-10-10 MED ORDER — LIDOCAINE VISCOUS HCL 2 % MT SOLN: 15.0000 mL | OROMUCOSAL | 0 refills | Status: DC | PRN
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
## 2022-01-03 DIAGNOSIS — S02609A Fracture of mandible, unspecified, initial encounter for closed fracture: Secondary | ICD-10-CM | POA: Insufficient documentation

## 2022-01-03 DIAGNOSIS — S83289A Other tear of lateral meniscus, current injury, unspecified knee, initial encounter: Secondary | ICD-10-CM | POA: Insufficient documentation

## 2022-01-03 DIAGNOSIS — S01512A Laceration without foreign body of oral cavity, initial encounter: Secondary | ICD-10-CM | POA: Insufficient documentation

## 2022-01-03 DIAGNOSIS — M25561 Pain in right knee: Secondary | ICD-10-CM | POA: Insufficient documentation

## 2022-01-03 DIAGNOSIS — R739 Hyperglycemia, unspecified: Secondary | ICD-10-CM | POA: Insufficient documentation

## 2022-01-03 DIAGNOSIS — Z720 Tobacco use: Secondary | ICD-10-CM | POA: Insufficient documentation

## 2022-01-03 DIAGNOSIS — M179 Osteoarthritis of knee, unspecified: Secondary | ICD-10-CM | POA: Insufficient documentation
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
## 2023-08-01 ENCOUNTER — Other Ambulatory Visit: Payer: Self-pay | Admitting: Physician Assistant

## 2023-08-01 DIAGNOSIS — N6320 Unspecified lump in the left breast, unspecified quadrant: Secondary | ICD-10-CM

## 2023-08-06 ENCOUNTER — Other Ambulatory Visit: Payer: Medicaid Other

## 2023-08-22 ENCOUNTER — Other Ambulatory Visit: Payer: Self-pay | Admitting: Physician Assistant

## 2023-08-22 ENCOUNTER — Ambulatory Visit
Admission: RE | Admit: 2023-08-22 | Discharge: 2023-08-22 | Disposition: A | Discharge status: Home / Self Care | Source: Ambulatory Visit | Attending: Physician Assistant | Admitting: Physician Assistant

## 2023-08-22 ENCOUNTER — Ambulatory Visit
Admission: RE | Admit: 2023-08-22 | Discharge: 2023-08-22 | Disposition: A | Discharge status: Home / Self Care | Payer: Medicaid Other | Source: Ambulatory Visit | Attending: Physician Assistant | Admitting: Physician Assistant

## 2023-08-22 DIAGNOSIS — N6310 Unspecified lump in the right breast, unspecified quadrant: Secondary | ICD-10-CM

## 2023-08-22 DIAGNOSIS — R590 Localized enlarged lymph nodes: Secondary | ICD-10-CM

## 2023-08-22 DIAGNOSIS — N6320 Unspecified lump in the left breast, unspecified quadrant: Secondary | ICD-10-CM

## 2023-08-23 ENCOUNTER — Ambulatory Visit
Admission: RE | Admit: 2023-08-23 | Discharge: 2023-08-23 | Disposition: A | Discharge status: Home / Self Care | Source: Ambulatory Visit | Attending: Physician Assistant | Admitting: Physician Assistant

## 2023-08-23 DIAGNOSIS — R590 Localized enlarged lymph nodes: Secondary | ICD-10-CM

## 2023-08-23 DIAGNOSIS — N6320 Unspecified lump in the left breast, unspecified quadrant: Secondary | ICD-10-CM

## 2023-08-23 DIAGNOSIS — C50919 Malignant neoplasm of unspecified site of unspecified female breast: Secondary | ICD-10-CM

## 2023-08-23 HISTORY — PX: BREAST BIOPSY: SHX20

## 2023-08-23 HISTORY — DX: Malignant neoplasm of unspecified site of unspecified female breast: C50.919

## 2023-08-26 LAB — SURGICAL PATHOLOGY

## 2023-08-27 ENCOUNTER — Telehealth: Payer: Self-pay | Admitting: *Deleted

## 2023-08-27 NOTE — Telephone Encounter (Signed)
 LVM to patient in reference to upcoming appointment, left my contact to call back, did email and mail packet

## 2023-08-30 ENCOUNTER — Telehealth: Payer: Self-pay | Admitting: *Deleted

## 2023-08-30 NOTE — Telephone Encounter (Signed)
 Spoke to patient to confirm upcoming afternoon [facility] clinic appointment on 4/2, paperwork will be sent via mail/email  Gave location and time, also informed patient that the surgeon's office would be calling as well to get information from them similar to the packet that they will be receiving so make sure to do both.  Reminded patient that all providers will be coming to the clinic to see them HERE and if they had any questions to not hesitate to reach back out to myself or their navigators.

## 2023-09-02 ENCOUNTER — Encounter: Payer: Self-pay | Admitting: *Deleted

## 2023-09-02 DIAGNOSIS — C50412 Malignant neoplasm of upper-outer quadrant of left female breast: Secondary | ICD-10-CM | POA: Insufficient documentation

## 2023-09-04 ENCOUNTER — Ambulatory Visit: Payer: Self-pay | Admitting: Surgery

## 2023-09-04 ENCOUNTER — Ambulatory Visit
Admission: RE | Admit: 2023-09-04 | Discharge: 2023-09-04 | Disposition: A | Discharge status: Home / Self Care | Source: Ambulatory Visit | Attending: Radiation Oncology | Admitting: Radiation Oncology

## 2023-09-04 ENCOUNTER — Inpatient Hospital Stay (HOSPITAL_BASED_OUTPATIENT_CLINIC_OR_DEPARTMENT_OTHER): Admitting: Genetic Counselor

## 2023-09-04 ENCOUNTER — Ambulatory Visit: Attending: Surgery | Admitting: Physical Therapy

## 2023-09-04 ENCOUNTER — Inpatient Hospital Stay: Attending: Hematology and Oncology

## 2023-09-04 ENCOUNTER — Inpatient Hospital Stay (HOSPITAL_BASED_OUTPATIENT_CLINIC_OR_DEPARTMENT_OTHER): Admitting: Hematology and Oncology

## 2023-09-04 ENCOUNTER — Encounter: Payer: Self-pay | Admitting: General Practice

## 2023-09-04 ENCOUNTER — Encounter: Payer: Self-pay | Admitting: Hematology and Oncology

## 2023-09-04 VITALS — BP 104/65 | HR 53 | Temp 97.2°F | Resp 16 | Ht 76.0 in | Wt 223.4 lb

## 2023-09-04 DIAGNOSIS — C50412 Malignant neoplasm of upper-outer quadrant of left female breast: Secondary | ICD-10-CM

## 2023-09-04 DIAGNOSIS — Z1731 Human epidermal growth factor receptor 2 positive status: Secondary | ICD-10-CM | POA: Insufficient documentation

## 2023-09-04 DIAGNOSIS — C50912 Malignant neoplasm of unspecified site of left female breast: Secondary | ICD-10-CM

## 2023-09-04 DIAGNOSIS — Z170 Estrogen receptor positive status [ER+]: Secondary | ICD-10-CM | POA: Insufficient documentation

## 2023-09-04 DIAGNOSIS — Z809 Family history of malignant neoplasm, unspecified: Secondary | ICD-10-CM

## 2023-09-04 DIAGNOSIS — Z1721 Progesterone receptor positive status: Secondary | ICD-10-CM | POA: Diagnosis not present

## 2023-09-04 DIAGNOSIS — M25511 Pain in right shoulder: Secondary | ICD-10-CM | POA: Insufficient documentation

## 2023-09-04 DIAGNOSIS — F17210 Nicotine dependence, cigarettes, uncomplicated: Secondary | ICD-10-CM | POA: Insufficient documentation

## 2023-09-04 DIAGNOSIS — R293 Abnormal posture: Secondary | ICD-10-CM | POA: Diagnosis present

## 2023-09-04 LAB — CBC WITH DIFFERENTIAL (CANCER CENTER ONLY)
Abs Immature Granulocytes: 0.01 10*3/uL (ref 0.00–0.07)
Basophils Absolute: 0.1 10*3/uL (ref 0.0–0.1)
Basophils Relative: 1 %
Eosinophils Absolute: 0.7 10*3/uL — ABNORMAL HIGH (ref 0.0–0.5)
Eosinophils Relative: 12 %
HCT: 41.6 % (ref 36.0–46.0)
Hemoglobin: 14.1 g/dL (ref 12.0–15.0)
Immature Granulocytes: 0 %
Lymphocytes Relative: 30 %
Lymphs Abs: 1.7 10*3/uL (ref 0.7–4.0)
MCH: 29.3 pg (ref 26.0–34.0)
MCHC: 33.9 g/dL (ref 30.0–36.0)
MCV: 86.3 fL (ref 80.0–100.0)
Monocytes Absolute: 0.3 10*3/uL (ref 0.1–1.0)
Monocytes Relative: 6 %
Neutro Abs: 2.9 10*3/uL (ref 1.7–7.7)
Neutrophils Relative %: 51 %
Platelet Count: 209 10*3/uL (ref 150–400)
RBC: 4.82 MIL/uL (ref 3.87–5.11)
RDW: 12.3 % (ref 11.5–15.5)
WBC Count: 5.7 10*3/uL (ref 4.0–10.5)
nRBC: 0 % (ref 0.0–0.2)

## 2023-09-04 LAB — CMP (CANCER CENTER ONLY)
ALT: 13 U/L (ref 0–44)
AST: 16 U/L (ref 15–41)
Albumin: 4.3 g/dL (ref 3.5–5.0)
Alkaline Phosphatase: 47 U/L (ref 38–126)
Anion gap: 3 — ABNORMAL LOW (ref 5–15)
BUN: 8 mg/dL (ref 6–20)
CO2: 30 mmol/L (ref 22–32)
Calcium: 9.2 mg/dL (ref 8.9–10.3)
Chloride: 106 mmol/L (ref 98–111)
Creatinine: 0.85 mg/dL (ref 0.44–1.00)
GFR, Estimated: >60 mL/min (ref >60)
Glucose, Bld: 87 mg/dL (ref 70–99)
Potassium: 4.4 mmol/L (ref 3.5–5.1)
Sodium: 139 mmol/L (ref 135–145)
Total Bilirubin: 0.5 mg/dL (ref 0.0–1.2)
Total Protein: 7.2 g/dL (ref 6.5–8.1)

## 2023-09-04 LAB — GENETIC SCREENING ORDER

## 2023-09-04 NOTE — Assessment & Plan Note (Signed)
 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 After reviewing the pathology in detail, we proceeded to discuss the different treatment options between surgery, radiation, chemotherapy, antiestrogen therapies.  Recommendation based on multidisciplinary tumor board: 1. Neoadjuvant chemotherapy with TCH Perjeta 6 cycles followed by Herceptin Perjeta maintenance versus Kadcyla maintenance (based on response to neoadjuvant chemo) for 1 year 2. Followed by breast conserving surgery if possible with sentinel lymph node study 3. Followed by adjuvant radiation therapy if patient had lumpectomy  Chemotherapy Counseling: I discussed the risks and benefits of chemotherapy including the risks of nausea/ vomiting, risk of infection from low WBC count, fatigue due to chemo or anemia, bruising or bleeding due to low platelets, mouth sores, loss/ change in taste and decreased appetite. Liver and kidney function will be monitored through out chemotherapy as abnormalities in liver and kidney function may be a side effect of treatment. Cardiac dysfunction due to Herceptin and Perjeta and neuropathy risk from Taxotere were discussed in detail. Risk of permanent bone marrow dysfunction due to chemo were also discussed.  Plan: 1. Port placement 2. Echocardiogram 3. Chemotherapy class 4. Breast MRI  Return to clinic in 2 weeks to start chemotherapy.

## 2023-09-04 NOTE — Progress Notes (Signed)
 [facility] Multidisciplinary Clinic Spiritual Care Note  Met with Francille "Truddie Hidden" and SO Keta in Breast Multidisciplinary Clinic to introduce Support Center team/resources.  She completed SDOH screening; results follow below.    SDOH Screenings   Food Insecurity: Food Insecurity Present (09/04/2023)  Housing: High Risk (09/04/2023)  Transportation Needs: No Transportation Needs (09/04/2023)  Utilities: Not At Risk (09/04/2023)  Depression (PHQ2-9): Medium Risk (09/04/2023)  Tobacco Use: High Risk (09/04/2023)    Chaplain and patient discussed common feelings and emotions when being diagnosed with cancer, and the importance of support during treatment.  Chaplain informed patient of the support team and support services at [facility].  Chaplain provided contact information and encouraged patient to call with any questions or concerns.  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 She describes herself as someone who tends to keep to herself and is primarily keeping her diagnosis private at this time.  Will place Social Work referral per [employer] screening results.    Follow up needed: Yes.  Truddie Hidden welcomes a follow-up call in ca two weeks.   [address] [place], [place], [facility] Pager [phone] Voicemail [phone]

## 2023-09-04 NOTE — Progress Notes (Unsigned)
 REFERRING PROVIDER: Serena Croissant, MD [address] [place],  [place] [address]   PRIMARY PROVIDER:  Patient, No Pcp Per  PRIMARY REASON FOR VISIT:  ***  HISTORY OF PRESENT ILLNESS:   Donna [name], a 33 y.o. female, was seen for a [place] cancer genetics consultation at the request of Dr. Pamelia Hoit due to a personal history of breast cancer.  Donna [name] presents to clinic today to discuss the possibility of a hereditary predisposition to cancer, to discuss genetic testing, and to further clarify her future cancer risks, as well as potential cancer risks for family members.   In March 2025, at the age of 57, Donna [name] was diagnosed with invasive ductal carcinoma of the left breast (ER+/PR+/HER2+).  Her treatment plan will begin with neoadjuvant chemotherapy.    CANCER HISTORY:  Oncology History  Malignant neoplasm of upper-outer quadrant of left breast in female, estrogen receptor positive (HCC)  08/23/2023 Initial Diagnosis   Palpable left breast mass at 2 o'clock position measuring 3.1 cm, 1 axillary lymph node biopsy: Concordant, D density breast, biopsy: Grade 2 IDC ER 90%, PR 95%, Ki67 30%, HER2 3+ positive   09/04/2023 Cancer Staging   Staging form: Breast, AJCC 8th Edition - Clinical: Stage IB (cT2, cN0, cM0, G2, ER+, PR+, HER2+) - Signed by Serena Croissant, MD on 09/04/2023 Stage prefix: Initial diagnosis Histologic grading system: 3 grade system      SCREENING/RISK FACTORS:  Mammogram within the last year: *** Number of breast biopsies: {Numbers 1-12 multi-select:20307}. Colonoscopy: {Yes/No-Ex:120004}; {normal/abnormal/not examined:14677}. Hysterectomy: {Yes/No-Ex:120004}.  Ovaries intact: {Yes/No-Ex:120004}.  Menarche was at age ***.  First live birth at age ***.  Menopausal status: {Menopause:31378}.  OCP use for approximately {Numbers 1-12 multi-select:20307} years.  HRT use: {Numbers 1-12 multi-select:20307} years. History of radiation therapy to  breast tissue before age 27: {Yes/No-Ex:120004} Dermatology screening: {Yes/No-Ex:120004}  Blood transfusion within past 4 weeks: {Yes/No-Ex:120004} History of bone marrow transplant: {Yes/No-Ex:120004}   Past Medical History:  Diagnosis Date   Breast cancer [facility])     Past Surgical History:  Procedure Laterality Date   BREAST BIOPSY Left 08/23/2023   [place] LT BREAST BX W LOC DEV 1ST LESION IMG BX SPEC US GUIDE 08/23/2023 [facility] MAMMOGRAPHY    FAMILY HISTORY:  We obtained a detailed, 4-generation family history.  Significant diagnoses are listed below: No family history on file.  Donna [name] is {aware/unaware} of previous family history of genetic testing for hereditary cancer risks. Patient's maternal ancestors are of *** descent, and paternal ancestors are of *** descent. There {IS NO:12509} reported Ashkenazi Jewish ancestry. There {IS NO:12509} known consanguinity.  GENETIC COUNSELING ASSESSMENT: Donna [name] is a 33 y.o. female with a personal history of breast cancer which is somewhat suggestive of a hereditary cancer syndrome and predisposition to cancer given her age of diagnosis. We, therefore, discussed and recommended the following at today's visit.   DISCUSSION: We discussed that 5 - 10% of cancer is hereditary.  Most cases of hereditary breast cancer are associated with mutations in BRCA1/2.  There are other genes that can be associated with hereditary breast cancer syndromes.  We discussed that testing is beneficial for several reasons including knowing how to follow individuals for their cancer risks, identifying whether potential treatment/surgery options would be beneficial, and understanding if other family members could be at an increased risk for cancer and allowing them to undergo genetic testing.   We reviewed the characteristics, features and inheritance patterns of hereditary cancer syndromes. We also discussed genetic  testing, including the appropriate family members  to test, the process of testing, insurance coverage and turn-around-time for results. We discussed the implications of a negative, positive, carrier and/or variant of uncertain significant result. We recommended Donna [name] pursue genetic testing for a panel that includes genes associated with *** cancer.   Donna [name]  was offered a common hereditary cancer panel (~40 genes) and an expanded pan-cancer panel (~70 genes). Donna [name] was informed of the benefits and limitations of each panel, including that expanded pan-cancer panels contain genes that do not have clear management guidelines at this point in time.  We also discussed that as the number of genes included on a panel increases, the chances of variants of uncertain significance increases.  After considering the benefits and limitations of each gene panel, Donna [name]  elected to have *** through ***.   Based on Donna [name]'s {Personal/family:20331} history of cancer, she meets medical criteria for genetic testing. Despite that she meets criteria, she may still have an out of pocket cost. We discussed that if her out of pocket cost for testing is over $100, the laboratory should contact her and discuss the self-pay prices and/or patient pay assistance programs.    ***We reviewed the characteristics, features and inheritance patterns of hereditary cancer syndromes. We also discussed genetic testing, including the appropriate family members to test, the process of testing, insurance coverage and turn-around-time for results. We discussed the implications of a negative, positive and/or variant of uncertain significant result. In order to get genetic test results in a timely manner so that Donna [name] can use these genetic test results for surgical decisions, we recommended Donna [name] pursue genetic testing for the ***. Once complete, we recommend Donna [name] pursue reflex genetic testing to the *** gene panel.   Based on Ms. Kwan's  {Personal/family:20331} history of cancer, she meets medical criteria for genetic testing. Despite that she meets criteria, she may still have an out of pocket cost.   ***We discussed with Donna [name] that the {Personal/family:20331} history does not meet insurance or NCCN criteria for genetic testing and, therefore, is not highly consistent with a familial hereditary cancer syndrome.  We feel she is at low risk to harbor a gene mutation associated with such a condition. Thus, we did not recommend any genetic testing, at this time, and recommended Donna [name] continue to follow the cancer screening guidelines given by her primary healthcare provider.  ***In order to estimate her chance of having a {CA GENE:62345} mutation, we used statistical models ({GENMODELS:62370}) that consider her personal medical history, family history and ancestry.  Because each model is different, there can be a lot of variability in the risks they give.  Therefore, these numbers must be considered a rough range and not a precise risk of having a {CA GENE:62345} mutation.  These models estimate that she has approximately a ***-***% chance of having a mutation. Based on this assessment of her family and personal history, genetic testing {IS/ISNOT:34056} recommended.  ***Based on the patient's {Personal/family:20331} history, a statistical model ({GENMODELS:62370}) was used to estimate her risk of developing {CA HX:54794}. This estimates her lifetime risk of developing {CA HX:54794} to be approximately ***%. This estimation does not consider any genetic testing results.  The patient's lifetime breast cancer risk is a preliminary estimate based on available information using one of several models endorsed by the American Cancer Society (ACS). The ACS recommends consideration of breast MRI screening as an adjunct to mammography for patients at high  risk (defined as 20% or greater lifetime risk).   ***Donna [name] has been determined  to be at high risk for breast cancer.  Therefore, we recommend that annual screening with mammography and breast MRI be performed.  ***begin at age 6, or 10 years prior to the age of breast cancer diagnosis in a relative (whichever is earlier).  We discussed that Donna [name] should discuss her individual situation with her referring physician and determine a breast cancer screening plan with which they are both comfortable.    PLAN: After considering the risks, benefits, and limitations, Donna [name] provided informed consent to pursue genetic testing and the blood sample was sent to {Lab} Laboratories for analysis of the {test}. Results should be available within approximately {TAT TIME} weeks, at which point they will be disclosed by telephone*** to Ms. Levings, as will any additional recommendations warranted by these results. Ms. Cilento will receive a summary of her genetic counseling visit and a copy of her results once available. This information will also be available in Epic.   *** Despite our recommendation, Ms. Viviani did not wish to pursue genetic testing at today's visit. We understand this decision and remain available to coordinate genetic testing at any time in the future. We, therefore, recommend Ms. Baize continue to follow the cancer screening guidelines given by her primary healthcare provider.  ***Based on Ms. Stmarie's family history, we recommended her ***, who was diagnosed with *** at age ***, have genetic counseling and testing. Ms. Mee can let us know if we can be of any assistance in coordinating genetic counseling and/or testing for appropriate relatives.   Lastly, we encouraged Ms. Maka to remain in contact with cancer genetics annually so that we can continuously update the family history and inform her of any changes in cancer genetics and testing that may be of benefit for this family.   Ms. Brillhart questions were answered to her satisfaction today.  Our contact information was provided should additional questions or concerns arise. Thank you for the referral and allowing [place] to share in the care of your patient.   Arleny Kruger M. Rennie Plowman, MS, [facility] Genetic Counselor [email or web address] (P) [phone]   *** minutes were spent on the date of the encounter in service to the patient including preparation, face-to-face consultation, documentation and care coordination.  ***The patient was accompanied by ***.  ***The patient was seen alone.  Drs. Gunnar Bulla and/or Mosetta Putt were available to discuss this case as needed.    _______________________________________________________________________ For Office Staff:  Number of people involved in session: *** Was an Intern/ student involved with case: {YES/NO:63}

## 2023-09-04 NOTE — Progress Notes (Signed)
 Radiation Oncology         [phone] ________________________________  Name: Donna [name]        MRN: [id number]  Date of Service: 09/04/2023 DOB: December 10, 1990  WJ:[id number], No Pcp Per  Harriette Bouillon, MD     REFERRING PHYSICIAN: Harriette Bouillon, MD   DIAGNOSIS: There were no encounter diagnoses. ***   HISTORY OF PRESENT ILLNESS: Donna [name] is a 33 y.o. female seen in the multidisciplinary breast clinic for a new diagnosis of left breast cancer. The patient was noted to have a palpable left breast mass on physical exam. She proceeded with diagnostic mammogram and ultrasound on 08/22/2023 that showed a 3.1 cm mass in the upper outer aspect of the left breast. A single abnormal appearing left axillary lymph node was also noted. 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 Biopsy of the left axilla was negative.   She is seen today to discuss treatment recommendations of her cancer.      PREVIOUS RADIATION THERAPY: {EXAM; YES/NO:19492::"No"}   PAST MEDICAL HISTORY: No past medical history on file.     PAST SURGICAL HISTORY: Past Surgical History:  Procedure Laterality Date   BREAST BIOPSY Left 08/23/2023   [place] LT BREAST BX W LOC DEV 1ST LESION IMG BX SPEC US GUIDE 08/23/2023 [facility]AMMOGRAP[facility]     FAMILY HISTORY: No family history on file.   SOCIAL HISTORY:  reports that she has been smoking cigars and cigarettes. She has a 4.5 pack-year smoking history. She has never used smokeless tobacco. She reports current alcohol use. She reports that she does not use drugs.   ALLERGIES: Patient has no known allergies.   MEDICATIONS:  Current Outpatient Medications  Medication Sig Dispense Refill   amoxicillin (AMOXIL) 500 MG capsule Take 1 capsule (500 mg total) by mouth 3 (three) times daily. 21 capsule 0   HYDROcodone-acetaminophen  (NORCO/VICODIN) 5-325 MG tablet Take 1 tablet by mouth every 4 (four) hours as needed for moderate pain. 12 tablet 0   lidocaine (XYLOCAINE) 2 % solution Use as directed 15 mLs in the mouth or throat every 3 (three) hours as needed for mouth pain. 200 mL 0   No current facility-administered medications for this visit.     REVIEW OF SYSTEMS: On review of systems, the patient reports that she is doing well overall. No breast specific complaints are verbalized.  ***      PHYSICAL EXAM:  Wt Readings from Last 3 Encounters:  12/15/19 166 lb (75.3 kg)  10/06/19 175 lb (79.4 kg)  08/09/19 175 lb (79.4 kg)   Temp Readings from Last 3 Encounters:  10/10/20 97.9 F (36.6 C) (Axillary)  08/09/19 97.7 F (36.5 C) (Oral)  05/03/19 97.7 F (36.5 C) (Oral)   BP Readings from Last 3 Encounters:  10/10/20 125/85  08/09/19 111/78  05/03/19 140/86   Pulse Readings from Last 3 Encounters:  10/10/20 98  08/09/19 (!) 51  05/03/19 98    In general this is a well appearing *** female in no acute distress. She's alert and oriented x4 and appropriate throughout the examination. Cardiopulmonary assessment is negative for acute distress and she exhibits normal effort. Bilateral breast exam is deferred.    ECOG = ***  0 - Asymptomatic (Fully active, able to carry on all predisease activities without restriction)  1 - Symptomatic but completely ambulatory (Restricted in physically strenuous activity but  ambulatory and able to carry out work of a light or sedentary nature. For example, light housework, office work)  2 - Symptomatic, <50% in bed during the day (Ambulatory and capable of all self care but unable to carry out any work activities. Up and about more than 50% of waking hours)  3 - Symptomatic, >50% in bed, but not bedbound (Capable of only limited self-care, confined to bed or chair 50% or more of waking hours)  4 - Bedbound (Completely disabled. Cannot carry on any self-care. Totally  confined to bed or chair)  5 - Death   Santiago Glad MM, Creech RH, Tormey DC, et al. [phone]). "Toxicity and response criteria of the [facility]". Am. Evlyn Clines. Oncol. 5 (6): 649-55    LABORATORY DATA:  Lab Results  Component Value Date   WBC 8.6 10/10/2020   HGB 14.1 10/10/2020   HCT 42.4 10/10/2020   MCV 90.4 10/10/2020   PLT 167 10/10/2020   Lab Results  Component Value Date   NA 136 10/10/2020   K 3.6 10/10/2020   CL 102 10/10/2020   CO2 27 10/10/2020   Lab Results  Component Value Date   ALT 22 05/03/2019   AST 41 05/03/2019   ALKPHOS 50 05/03/2019   BILITOT 1.0 05/03/2019      RADIOGRAPHY: [place] LT BREAST BX W LOC DEV 1ST LESION IMG BX SPEC US GUIDE Addendum Date: 08/26/2023 ADDENDUM REPORT: 08/26/2023 14:34 ADDENDUM: PATHOLOGY revealed: Site 1. Breast, left, needle core biopsy, 2:00 6 cmfn (ribbon clip) : INVASIVE DUCTAL CARCINOMA. OVERALL GRADE: 2. LYMPHOVASCULAR INVASION: NOT IDENTIFIED. CANCER LENGTH: 1.6 CM. CALCIFICATIONS: NOT IDENTIFIED. Pathology results are CONCORDANT with imaging findings, per Dr. Frederico Hamman. PATHOLOGY revealed: Site 2. Lymph node, needle/core biopsy, Left axillary (hydromark clip) : ONE BENIGN LYMPH NODE, NEGATIVE FOR METASTATIC CARCINOMA. Pathology results are CONCORDANT with imaging findings, per Dr. Frederico Hamman. Pathology results and recommendations below were discussed with patient by telephone on 08/26/2023 by Randa Lynn RN. Patient reported biopsy site within normal limits with slight tenderness at the site. Post biopsy care instructions were reviewed, questions were answered and my direct phone number was provided to patient. Patient was instructed to call [facility] if any concerns or questions arise related to the biopsy. RECOMMENDATIONS: 1. Patient was referred to the [facility] at [facility] by Randa Lynn RN on 08/26/2023. Appointment is  on 09/04/2023. 2. Recommend pretreatment bilateral breast MRI with and without contrast to assess extent of breast disease, given the patient's breast density and young age. Pathology results reported by Randa Lynn RN on 08/26/2023. Electronically Signed   By: Frederico Hamman M.D.   On: 08/26/2023 14:34   Result Date: 08/26/2023 CLINICAL DATA:  33 year old presenting for ultrasound-guided biopsy of a left breast mass and left axillary lymph node. EXAM: ULTRASOUND GUIDED LEFT BREAST CORE NEEDLE BIOPSY COMPARISON:  Previous exam(s). PROCEDURE: I met with the patient and we discussed the procedure of ultrasound-guided biopsy, including benefits and alternatives. We discussed the high likelihood of a successful procedure. We discussed the risks of the procedure, including infection, bleeding, tissue injury, clip migration, and inadequate sampling. Informed written consent was given. The usual time-out protocol was performed immediately prior to the procedure. Lesion quadrant: Upper outer quadrant Using sterile technique and 1% Lidocaine as local anesthetic, under direct ultrasound visualization, a 14 gauge spring-loaded device was used to perform biopsy of a mass in the left breast at  2 o'clock, 6 cm from the nipple using a lateral approach. At the conclusion of the procedure a ribbon shaped tissue marker clip was deployed into the biopsy cavity. The ultrasound image with the ribbon clip documented was not able to be obtained because the patient was feeling faint, but did not lose consciousness. Lesion quadrant: Left axilla Using sterile technique and 1% Lidocaine as local anesthetic, under direct ultrasound visualization, a 14 gauge spring-loaded device was used to perform biopsy of left axillary lymph node using a lateral approach. At the conclusion of the procedure a HydroMARK shaped tissue marker clip was deployed into the biopsy cavity. Follow up 2 view mammogram was performed and dictated separately. IMPRESSION:  1. Ultrasound guided biopsy of a left breast mass at 2 o'clock (ribbon clip). No apparent complications. 2. Ultrasound-guided biopsy of a left axillary lymph node (HydroMARK clip). No apparent complications. Electronically Signed: By: Frederico Hamman M.D. On: 08/23/2023 15:20   [place] AXILLARY NODE CORE BIOPSY LEFT Addendum Date: 08/26/2023 ADDENDUM REPORT: 08/26/2023 14:34 ADDENDUM: PATHOLOGY revealed: Site 1. Breast, left, needle core biopsy, 2:00 6 cmfn (ribbon clip) : INVASIVE DUCTAL CARCINOMA. OVERALL GRADE: 2. LYMPHOVASCULAR INVASION: NOT IDENTIFIED. CANCER LENGTH: 1.6 CM. CALCIFICATIONS: NOT IDENTIFIED. Pathology results are CONCORDANT with imaging findings, per Dr. Frederico Hamman. PATHOLOGY revealed: Site 2. Lymph node, needle/core biopsy, Left axillary (hydromark clip) : ONE BENIGN LYMPH NODE, NEGATIVE FOR METASTATIC CARCINOMA. Pathology results are CONCORDANT with imaging findings, per Dr. Frederico Hamman. Pathology results and recommendations below were discussed with patient by telephone on 08/26/2023 by Randa Lynn RN. Patient reported biopsy site within normal limits with slight tenderness at the site. Post biopsy care instructions were reviewed, questions were answered and my direct phone number was provided to patient. Patient was instructed to call [facility] if any concerns or questions arise related to the biopsy. RECOMMENDATIONS: 1. Patient was referred to the [facility] at [facility] by Randa Lynn RN on 08/26/2023. Appointment is on 09/04/2023. 2. Recommend pretreatment bilateral breast MRI with and without contrast to assess extent of breast disease, given the patient's breast density and young age. Pathology results reported by Randa Lynn RN on 08/26/2023. Electronically Signed   By: Frederico Hamman M.D.   On: 08/26/2023 14:34   Result Date: 08/26/2023 CLINICAL DATA:  33 year old presenting for  ultrasound-guided biopsy of a left breast mass and left axillary lymph node. EXAM: ULTRASOUND GUIDED LEFT BREAST CORE NEEDLE BIOPSY COMPARISON:  Previous exam(s). PROCEDURE: I met with the patient and we discussed the procedure of ultrasound-guided biopsy, including benefits and alternatives. We discussed the high likelihood of a successful procedure. We discussed the risks of the procedure, including infection, bleeding, tissue injury, clip migration, and inadequate sampling. Informed written consent was given. The usual time-out protocol was performed immediately prior to the procedure. Lesion quadrant: Upper outer quadrant Using sterile technique and 1% Lidocaine as local anesthetic, under direct ultrasound visualization, a 14 gauge spring-loaded device was used to perform biopsy of a mass in the left breast at 2 o'clock, 6 cm from the nipple using a lateral approach. At the conclusion of the procedure a ribbon shaped tissue marker clip was deployed into the biopsy cavity. The ultrasound image with the ribbon clip documented was not able to be obtained because the patient was feeling faint, but did not lose consciousness. Lesion quadrant: Left axilla Using sterile technique and 1% Lidocaine as local anesthetic, under direct ultrasound visualization, a 14  gauge spring-loaded device was used to perform biopsy of left axillary lymph node using a lateral approach. At the conclusion of the procedure a HydroMARK shaped tissue marker clip was deployed into the biopsy cavity. Follow up 2 view mammogram was performed and dictated separately. IMPRESSION: 1. Ultrasound guided biopsy of a left breast mass at 2 o'clock (ribbon clip). No apparent complications. 2. Ultrasound-guided biopsy of a left axillary lymph node (HydroMARK clip). No apparent complications. Electronically Signed: By: Frederico Hamman M.D. On: 08/23/2023 15:20   MM CLIP PLACEMENT LEFT Result Date: 08/23/2023 CLINICAL DATA:  Post biopsy mammogram of the  left breast for clip placement. EXAM: 3D DIAGNOSTIC LEFT MAMMOGRAM POST ULTRASOUND BIOPSY COMPARISON:  Previous exam(s). ACR Breast Density Category d: The breasts are extremely dense, which lowers the sensitivity of mammography. FINDINGS: 3D Mammographic images were obtained following ultrasound guided biopsy of a left breast mass and left axillary lymph node. The biopsy marking clip is in expected position at the sites of biopsy. IMPRESSION: 1. Appropriate positioning of the ribbon shaped biopsy marking clip at the site of biopsy in the upper-outer left breast. 2. Appropriate positioning of the HydroMARK clip in the left axilla. Final Assessment: Post Procedure Mammograms for Marker Placement Electronically Signed   By: Frederico Hamman M.D.   On: 08/23/2023 15:32   MM 3D DIAGNOSTIC MAMMOGRAM BILATERAL BREAST Result Date: 08/22/2023 CLINICAL DATA:  33 year old female presents with palpable LEFT breast mass identified on self-examination. Baseline mammogram. EXAM: DIGITAL DIAGNOSTIC BILATERAL MAMMOGRAM WITH TOMOSYNTHESIS AND CAD; ULTRASOUND RIGHT BREAST LIMITED; ULTRASOUND LEFT BREAST LIMITED TECHNIQUE: Bilateral digital diagnostic mammography and breast tomosynthesis was performed. The images were evaluated with computer-aided detection. ; Targeted ultrasound examination of the right breast was performed; Targeted ultrasound examination of the left breast was performed. COMPARISON:  None available. ACR Breast Density Category d: The breasts are extremely dense, which lowers the sensitivity of mammography. FINDINGS: Full field views of both breasts and spot compression view of the LEFT breast demonstrate an irregular mass within the UPPER OUTER LEFT breast. A prominent LEFT axillary lymph node is noted. UPPER OUTER RIGHT breast asymmetry is identified. On physical exam, a firm palpable mass identified at the 2 o'clock position of the LEFT breast 6 cm from the nipple. Targeted ultrasound is performed, showing  the following: No sonographic abnormalities within the UPPER-OUTER RIGHT breast. No abnormal RIGHT axillary lymph nodes identified. A 3.1 x 2.5 x 2.5 cm irregular hypoechoic mass at the 2 o'clock position of the LEFT breast 2 cm from the nipple. Incidental benign cysts are noted at the 1:30 position of the LEFT breast 4 cm from the nipple. A single abnormal appearing LEFT axillary lymph node with cortical thickness of 4.5 mm noted. IMPRESSION: 1. 3.1 cm suspicious UPPER-OUTER LEFT breast mass. Ultrasound-guided biopsy is recommended. 2. Single abnormal appearing LEFT axillary lymph node with cortical thickening. Ultrasound-guided biopsy is recommended. 3. No other significant findings identified within either breast. RECOMMENDATION: Ultrasound-guided biopsy of UPPER-OUTER LEFT breast mass and ultrasound-guided biopsy of abnormal LEFT axillary lymph node. I have discussed the findings and recommendations with the patient. If applicable, a reminder letter will be sent to the patient regarding the next appointment. BI-RADS CATEGORY  4: Suspicious. Electronically Signed   By: Harmon Pier M.D.   On: 08/22/2023 11:05   [place] LIMITED ULTRASOUND INCLUDING AXILLA LEFT BREAST  Result Date: 08/22/2023 CLINICAL DATA:  33 year old female presents with palpable LEFT breast mass identified on self-examination. Baseline mammogram. EXAM: DIGITAL DIAGNOSTIC BILATERAL MAMMOGRAM WITH  TOMOSYNTHESIS AND CAD; ULTRASOUND RIGHT BREAST LIMITED; ULTRASOUND LEFT BREAST LIMITED TECHNIQUE: Bilateral digital diagnostic mammography and breast tomosynthesis was performed. The images were evaluated with computer-aided detection. ; Targeted ultrasound examination of the right breast was performed; Targeted ultrasound examination of the left breast was performed. COMPARISON:  None available. ACR Breast Density Category d: The breasts are extremely dense, which lowers the sensitivity of mammography. FINDINGS: Full field views of both breasts and spot  compression view of the LEFT breast demonstrate an irregular mass within the UPPER OUTER LEFT breast. A prominent LEFT axillary lymph node is noted. UPPER OUTER RIGHT breast asymmetry is identified. On physical exam, a firm palpable mass identified at the 2 o'clock position of the LEFT breast 6 cm from the nipple. Targeted ultrasound is performed, showing the following: No sonographic abnormalities within the UPPER-OUTER RIGHT breast. No abnormal RIGHT axillary lymph nodes identified. A 3.1 x 2.5 x 2.5 cm irregular hypoechoic mass at the 2 o'clock position of the LEFT breast 2 cm from the nipple. Incidental benign cysts are noted at the 1:30 position of the LEFT breast 4 cm from the nipple. A single abnormal appearing LEFT axillary lymph node with cortical thickness of 4.5 mm noted. IMPRESSION: 1. 3.1 cm suspicious UPPER-OUTER LEFT breast mass. Ultrasound-guided biopsy is recommended. 2. Single abnormal appearing LEFT axillary lymph node with cortical thickening. Ultrasound-guided biopsy is recommended. 3. No other significant findings identified within either breast. RECOMMENDATION: Ultrasound-guided biopsy of UPPER-OUTER LEFT breast mass and ultrasound-guided biopsy of abnormal LEFT axillary lymph node. I have discussed the findings and recommendations with the patient. If applicable, a reminder letter will be sent to the patient regarding the next appointment. BI-RADS CATEGORY  4: Suspicious. Electronically Signed   By: Harmon Pier M.D.   On: 08/22/2023 11:05   [place] LIMITED ULTRASOUND INCLUDING AXILLA RIGHT BREAST Result Date: 08/22/2023 CLINICAL DATA:  33 year old female presents with palpable LEFT breast mass identified on self-examination. Baseline mammogram. EXAM: DIGITAL DIAGNOSTIC BILATERAL MAMMOGRAM WITH TOMOSYNTHESIS AND CAD; ULTRASOUND RIGHT BREAST LIMITED; ULTRASOUND LEFT BREAST LIMITED TECHNIQUE: Bilateral digital diagnostic mammography and breast tomosynthesis was performed. The images were evaluated  with computer-aided detection. ; Targeted ultrasound examination of the right breast was performed; Targeted ultrasound examination of the left breast was performed. COMPARISON:  None available. ACR Breast Density Category d: The breasts are extremely dense, which lowers the sensitivity of mammography. FINDINGS: Full field views of both breasts and spot compression view of the LEFT breast demonstrate an irregular mass within the UPPER OUTER LEFT breast. A prominent LEFT axillary lymph node is noted. UPPER OUTER RIGHT breast asymmetry is identified. On physical exam, a firm palpable mass identified at the 2 o'clock position of the LEFT breast 6 cm from the nipple. Targeted ultrasound is performed, showing the following: No sonographic abnormalities within the UPPER-OUTER RIGHT breast. No abnormal RIGHT axillary lymph nodes identified. A 3.1 x 2.5 x 2.5 cm irregular hypoechoic mass at the 2 o'clock position of the LEFT breast 2 cm from the nipple. Incidental benign cysts are noted at the 1:30 position of the LEFT breast 4 cm from the nipple. A single abnormal appearing LEFT axillary lymph node with cortical thickness of 4.5 mm noted. IMPRESSION: 1. 3.1 cm suspicious UPPER-OUTER LEFT breast mass. Ultrasound-guided biopsy is recommended. 2. Single abnormal appearing LEFT axillary lymph node with cortical thickening. Ultrasound-guided biopsy is recommended. 3. No other significant findings identified within either breast. RECOMMENDATION: Ultrasound-guided biopsy of UPPER-OUTER LEFT breast mass and ultrasound-guided biopsy of abnormal  LEFT axillary lymph node. I have discussed the findings and recommendations with the patient. If applicable, a reminder letter will be sent to the patient regarding the next appointment. BI-RADS CATEGORY  4: Suspicious. Electronically Signed   By: Harmon Pier M.D.   On: 08/22/2023 11:05       IMPRESSION/PLAN: 1. *** Dr. Mitzi Hansen discussed the pathology findings and reviewed the nature of  ***. The consensus from the breast conference includes ***. Dr. Mitzi Hansen recommends external beam radiotherapy to the breast following her ***lumpectomy to reduce risks of local recurrence followed by antiestrogen therapy. We discussed the risks, benefits, short, and long term effects of radiotherapy, as well as the *** intent, and the patient is interested in proceeding. Dr. Mitzi Hansen discussed the delivery and logistics of radiotherapy and anticipates a course of *** weeks of radiotherapy to the *** breast with *** deep inspiration breath hold technique. We will see her back a few weeks after surgery to discuss the simulation process and anticipate starting radiotherapy about 4-6 weeks after surgery.   2. Possible genetic predisposition to malignancy. The patient is a candidate for genetic testing given *** personal and family history. She will meet with our geneticist today in clinic.   In a visit lasting *** minutes, greater than 50% of the time was spent face to face reviewing her case, as well as in preparation of, discussing, and coordinating the patient's care.  The above documentation reflects my direct findings during this shared patient visit. Please see the separate note by Dr. Mitzi Hansen on this date for the remainder of the patient's plan of care.    Joyice Faster, [place]    **Disclaimer: This note was dictated with voice recognition software. Similar sounding words can inadvertently be transcribed and this note may contain transcription errors which may not have been corrected upon publication of note.**

## 2023-09-04 NOTE — Progress Notes (Signed)
 [facility] CONSULT NOTE  Patient Care Team: Patient, No Pcp Per as PCP - General (General Practice) Pershing Proud, RN as Oncology Nurse Navigator Donnelly Angelica, RN as Oncology Nurse Navigator Harriette Bouillon, MD as Consulting Physician (General Surgery) Serena Croissant, MD as Consulting Physician (Hematology and Oncology) Dorothy Puffer, MD as Consulting Physician (Radiation Oncology)  CHIEF COMPLAINTS/PURPOSE OF CONSULTATION:  Newly diagnosed breast cancer  HISTORY OF PRESENTING ILLNESS:  Donna [name] is a 33 year old who presented with a palpable lump in the left breast measuring 3.1 cm by mammogram and ultrasound at 2 o'clock position.  1 lymph node in the axilla was biopsied and benign and concordant.  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  She was presented this morning at the multidisciplinary tumor board and she is here today accompanied her by her spouse who is also a truck driver to discuss her treatment plan.  I reviewed her records extensively and collaborated the history with the patient.  SUMMARY OF ONCOLOGIC HISTORY: Oncology History  Malignant neoplasm of upper-outer quadrant of left breast in female, estrogen receptor positive (HCC)  08/23/2023 Initial Diagnosis   Palpable left breast mass at 2 o'clock position measuring 3.1 cm, 1 axillary lymph node biopsy: Concordant, D density breast, biopsy: Grade 2 IDC ER 90%, PR 95%, Ki67 30%, HER2 3+ positive   09/04/2023 Cancer Staging   Staging form: Breast, AJCC 8th Edition - Clinical: Stage IB (cT2, cN0, cM0, G2, ER+, PR+, HER2+) - Signed by Serena Croissant, MD on 09/04/2023 Stage prefix: Initial diagnosis Histologic grading system: 3 grade system      MEDICAL HISTORY:  Past Medical History:  Diagnosis Date   Breast cancer [facility])     SURGICAL HISTORY: Past Surgical History:  Procedure Laterality Date   BREAST BIOPSY Left  08/23/2023   [place] LT BREAST BX W LOC DEV 1ST LESION IMG BX SPEC US GUIDE 08/23/2023 [facility] MAMMOGRAPHY    SOCIAL HISTORY: Social History   Socioeconomic History   Marital status: Single    Spouse name: Not on file   Number of children: Not on file   Years of education: Not on file   Highest education level: Not on file  Occupational History   Not on file  Tobacco Use   Smoking status: Every Day    Current packs/day: 0.50    Average packs/day: 0.5 packs/day for 9.0 years (4.5 ttl pk-yrs)    Types: Cigars, Cigarettes   Smokeless tobacco: Never  Substance and Sexual Activity   Alcohol use: Yes    Alcohol/week: 0.0 standard drinks of alcohol    Comment: weekends   Drug use: No   Sexual activity: Not Currently    Birth control/protection: None  Other Topics Concern   Not on file  Social History Narrative   Not on file   Social Drivers of Health   Financial Resource Strain: Not on file  Food Insecurity: Not on file  Transportation Needs: Not on file  Physical Activity: Not on file  Stress: Not on file  Social Connections: Not on file  Intimate Partner Violence: Unknown (09/07/2021)   Received from [facility]   HITS    Physically Hurt: Not on file    Insult or Talk Down To: Not on file    Threaten Physical Harm: Not on file    Scream or Curse: Not on file    FAMILY HISTORY: History  reviewed. No pertinent family history.  ALLERGIES:  has no known allergies.  MEDICATIONS:  Current Outpatient Medications  Medication Sig Dispense Refill   amoxicillin (AMOXIL) 500 MG capsule Take 1 capsule (500 mg total) by mouth 3 (three) times daily. 21 capsule 0   HYDROcodone-acetaminophen (NORCO/VICODIN) 5-325 MG tablet Take 1 tablet by mouth every 4 (four) hours as needed for moderate pain. 12 tablet 0   lidocaine (XYLOCAINE) 2 % solution Use as directed 15 mLs in the mouth or throat every 3 (three) hours as needed for mouth pain. 200 mL 0   No current facility-administered  medications for this visit.    REVIEW OF SYSTEMS:   Constitutional: Denies fevers, chills or abnormal night sweats Breast:  Denies any palpable lumps or discharge All other systems were reviewed with the patient and are negative.  PHYSICAL EXAMINATION: ECOG PERFORMANCE STATUS: 1 - Symptomatic but completely ambulatory  Vitals:   09/04/23 1338  BP: 104/65  Pulse: (!) 53  Resp: 16  Temp: (!) 97.2 F (36.2 C)  SpO2: 100%   Filed Weights   09/04/23 1338  Weight: 223 lb 6.4 oz (101.3 kg)    GENERAL:alert, no distress and comfortable BREAST: Large palpable lump in the left breast  LABORATORY DATA:  I have reviewed the data as listed Lab Results  Component Value Date   WBC 5.7 09/04/2023   HGB 14.1 09/04/2023   HCT 41.6 09/04/2023   MCV 86.3 09/04/2023   PLT 209 09/04/2023   Lab Results  Component Value Date   NA 139 09/04/2023   K 4.4 09/04/2023   CL 106 09/04/2023   CO2 30 09/04/2023    RADIOGRAPHIC STUDIES: I have personally reviewed the radiological reports and agreed with the findings in the report.  ASSESSMENT AND PLAN:  Malignant neoplasm of upper-outer quadrant of left breast in female, estrogen receptor positive (HCC) 08/23/2023:Palpable left breast mass at 2 o'clock position measuring 3.1 cm, 1 axillary lymph node biopsy: Concordant, D density breast, biopsy: Grade 2 IDC ER 90%, PR 95%, Ki67 30%, HER2 3+ positive  Pathology and radiology counseling: Discussed with the patient, the details of pathology including the type of breast cancer,the clinical staging, the significance of ER, PR and HER-2/neu receptors and the implications for treatment. After reviewing the pathology in detail, we proceeded to discuss the different treatment options between surgery, radiation, chemotherapy, antiestrogen therapies.  Recommendation based on multidisciplinary tumor board: 1. Neoadjuvant chemotherapy with TCH Perjeta 6 cycles followed by Herceptin Perjeta maintenance  versus Kadcyla maintenance (based on response to neoadjuvant chemo) for 1 year 2. Followed by breast conserving surgery if possible with sentinel lymph node study 3. Followed by adjuvant radiation therapy if patient had lumpectomy Genetic testing  Chemotherapy Counseling: I discussed the risks and benefits of chemotherapy including the risks of nausea/ vomiting, risk of infection from low WBC count, fatigue due to chemo or anemia, bruising or bleeding due to low platelets, mouth sores, loss/ change in taste and decreased appetite. Liver and kidney function will be monitored through out chemotherapy as abnormalities in liver and kidney function may be a side effect of treatment. Cardiac dysfunction due to Herceptin and Perjeta and neuropathy risk from Taxotere were discussed in detail. Risk of permanent bone marrow dysfunction due to chemo were also discussed.  After listening through the chemotherapy discussion, patient is not interested in doing chemotherapy. 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 We will  discuss hormonal therapy after surgery has been completed.  All questions were answered. The patient knows to call the clinic with any problems, questions or concerns.    Tamsen Meek, MD 09/04/23

## 2023-09-04 NOTE — Therapy (Unsigned)
 OUTPATIENT PHYSICAL THERAPY BREAST CANCER BASELINE EVALUATION   Patient Name: Donna [name] MRN: [id number] DOB:[birth year], 33 y.o., female Today's Date: 09/05/2023  END OF SESSION:  PT End of Session - 09/05/23 0810     Visit Number 1    Number of Visits 2    Date for PT Re-Evaluation 10/31/23    PT Start Time 1426    PT Stop Time 1443   Also saw pt from [id number] for a total of 41 min   PT Time Calculation (min) 17 min    Activity Tolerance Patient tolerated treatment well    Behavior During Therapy [name] for tasks assessed/performed             Past Medical History:  Diagnosis Date   Breast cancer [facility])    Past Surgical History:  Procedure Laterality Date   BREAST BIOPSY Left 08/23/2023   [place] LT BREAST BX W LOC DEV 1ST LESION IMG BX SPEC US GUIDE 08/23/2023 [facility] MAMMOGRAPHY   Patient Active Problem List   Diagnosis Date Noted   Malignant neoplasm of upper-outer quadrant of left breast in female, estrogen receptor positive (HCC) 09/02/2023   Acute lateral meniscus tear of right knee 12/15/2019    REFERRING PROVIDER: Dr. Almond [name]  REFERRING DIAG: Left breast cancer  THERAPY DIAG:  Malignant neoplasm of upper-outer quadrant of left breast in female, estrogen receptor positive (HCC)  Abnormal posture  Acute pain of right shoulder  Rationale for Evaluation and Treatment: Rehabilitation  ONSET DATE: 08/23/2023  SUBJECTIVE:                                                                                                                                                                                           SUBJECTIVE STATEMENT: Patient reports she is here today to be seen by her medical team for her newly diagnosed left breast cancer.   PERTINENT HISTORY:  Patient was diagnosed on 08/23/2023 with left grade 2 invasive ductal carcinoma breast cancer. It measures 3.1 cm and is located in the upper outer quadrant. It is triple positive with a Ki67 of 30%.    PATIENT GOALS:   reduce lymphedema risk and learn post op HEP.   PAIN:  Are you having pain? Yes: NPRS scale: varies but is not hurting at rest Pain location: Right upper arm and shoulder Pain description: sore; had something fall on her at work Aggravating factors: trying to reach overhead Relieving factors: rest  PRECAUTIONS: Active CA   RED FLAGS: None   HAND DOMINANCE: right  WEIGHT BEARING RESTRICTIONS: No  FALLS:  Has patient fallen in last 6 months? No  LIVING ENVIRONMENT:  Patient lives with: her girlfriend Donna [name] Lives in: House/apartment Has following equipment at home: None  OCCUPATION: truck driver  LEISURE: She does not exercise  PRIOR LEVEL OF FUNCTION: Independent   OBJECTIVE: Note: Objective measures were completed at Evaluation unless otherwise noted.  COGNITION: Overall cognitive status: Within functional limits for tasks assessed    POSTURE:  Forward head and rounded shoulders posture  UPPER EXTREMITY AROM/PROM:  A/PROM RIGHT   eval   Shoulder extension 39 and painful  Shoulder flexion 146 and painful  Shoulder abduction 152  Shoulder internal rotation 82  Shoulder external rotation 82    (Blank rows = not tested)  A/PROM LEFT   eval  Shoulder extension 54  Shoulder flexion 148  Shoulder abduction 168  Shoulder internal rotation 69  Shoulder external rotation 78    (Blank rows = not tested)  CERVICAL AROM: All within normal limits  UPPER EXTREMITY STRENGTH: Left UE WNL; Right UE not tested due to recent injury  LYMPHEDEMA ASSESSMENTS (in cm):   LANDMARK RIGHT   eval  10 cm proximal to olecranon process 29.5  Olecranon process 26.6  10 cm proximal to ulnar styloid process 22  Just proximal to ulnar styloid process 17.1  Across hand at thumb web space 20.4  At base of 2nd digit 6.5  (Blank rows = not tested)  LANDMARK LEFT   eval  10 cm proximal to olecranon process 29.8  Olecranon process 26.4  10 cm proximal to  ulnar styloid process 21.2  Just proximal to ulnar styloid process 16.6  Across hand at thumb web space 20.5  At base of 2nd digit 6.5  (Blank rows = not tested)  L-DEX LYMPHEDEMA SCREENING:  The patient was assessed using the L-Dex machine today to produce a lymphedema index baseline score. The patient will be reassessed on a regular basis (typically every 3 months) to obtain new L-Dex scores. If the score is > 6.5 points away from his/her baseline score indicating onset of subclinical lymphedema, it will be recommended to wear a compression garment for 4 weeks, 12 hours per day and then be reassessed. If the score continues to be > 6.5 points from baseline at reassessment, we will initiate lymphedema treatment. Assessing in this manner has a 95% rate of preventing clinically significant lymphedema.   L-DEX FLOWSHEETS - 09/05/23 0800       L-DEX LYMPHEDEMA SCREENING   Measurement Type Unilateral    L-DEX MEASUREMENT EXTREMITY Upper Extremity    POSITION  Standing    DOMINANT SIDE Right    At Risk Side Left    BASELINE SCORE (UNILATERAL) -1             QUICK DASH SURVEY:  Donna [name] - 09/05/23 0001     Open a tight or new jar No difficulty    Do heavy household chores (wash walls, wash floors) No difficulty    Carry a shopping bag or briefcase No difficulty    Wash your back No difficulty    Use a knife to cut food No difficulty    Recreational activities in which you take some force or impact through your arm, shoulder, or hand (golf, hammering, tennis) No difficulty    During the past week, to what extent has your arm, shoulder or hand problem interfered with your normal social activities with family, friends, neighbors, or groups? Not at all    During the past week, to what extent has your arm, shoulder or hand problem limited  your work or other regular daily activities Not at all    Arm, shoulder, or hand pain. None    Tingling (pins and needles) in your arm, shoulder, or  hand None    Difficulty Sleeping No difficulty    DASH Score 0 %             Quick Dash - 09/05/23 0001     Open a tight or new jar No difficulty    Do heavy household chores (wash walls, wash floors) No difficulty    Carry a shopping bag or briefcase No difficulty    Wash your back No difficulty    Use a knife to cut food No difficulty    Recreational activities in which you take some force or impact through your arm, shoulder, or hand (golf, hammering, tennis) No difficulty    During the past week, to what extent has your arm, shoulder or hand problem interfered with your normal social activities with family, friends, neighbors, or groups? Not at all    During the past week, to what extent has your arm, shoulder or hand problem limited your work or other regular daily activities Not at all    Arm, shoulder, or hand pain. None    Tingling (pins and needles) in your arm, shoulder, or hand None    Difficulty Sleeping No difficulty    DASH Score 0 %              PATIENT EDUCATION:  Education details: Time spent educating patient on aspects of self-care to maximize post op recovery. Patient was educated on where and how to get a post op compression bra to use to reduce post op edema. Patient was also educated on the use of SOZO screenings and surveillance principles for early identification of lymphedema onset. She was instructed to use the post op pillow in the axilla for pressure and pain relief. Patient educated on lymphedema risk reduction and post op shoulder/posture HEP. Person educated: Patient Education method: Explanation, Demonstration, Handout Education comprehension: Patient verbalized understanding and returned demonstration  HOME EXERCISE PROGRAM: Patient was instructed today in a home exercise program today for post op shoulder range of motion. These included active assist shoulder flexion in sitting, scapular retraction, wall walking with shoulder abduction, and  hands behind head external rotation.  She was encouraged to do these twice a day, holding 3 seconds and repeating 5 times when permitted by her physician.   ASSESSMENT:  CLINICAL IMPRESSION: Patient was diagnosed on 08/23/2023 with left grade 2 invasive ductal carcinoma breast cancer. It measures 3.1 cm and is located in the upper outer quadrant. It is triple positive with a Ki67 of 30%. Her multidisciplinary medical team met prior to her assessments to determine a recommended treatment plan. She is planning to have neoadjuvant chemotherapy followed by a left lumpectomy and sentinel node biopsy, radiation, and anti-estrogen therapy. She will benefit from a post op PT reassessment to determine needs and from L-Dex screens every 3 months for 2 years to detect subclinical lymphedema.  Pt will benefit from skilled therapeutic intervention to improve on the following deficits: Decreased knowledge of precautions, impaired UE functional use, pain, decreased ROM, postural dysfunction.   PT treatment/interventions: ADL/self-care home management, pt/family education, therapeutic exercise  REHAB POTENTIAL: Excellent  CLINICAL DECISION MAKING: Stable/uncomplicated  EVALUATION COMPLEXITY: Low   GOALS: Goals reviewed with patient? YES  LONG TERM GOALS: (STG=LTG)    Name Target Date Goal status  1 Pt will be  able to verbalize understanding of pertinent lymphedema risk reduction practices relevant to her dx specifically related to skin care.  Baseline:  No knowledge 09/05/2023 Achieved at eval  2 Pt will be able to return demo and/or verbalize understanding of the post op HEP related to regaining shoulder ROM. Baseline:  No knowledge 09/05/2023 Achieved at eval  3 Pt will be able to verbalize understanding of the importance of viewing the post op After Breast CA Class video for further lymphedema risk reduction education and therapeutic exercise.  Baseline:  No knowledge 09/05/2023 Achieved at eval  4 Pt  will demo she has regained full shoulder ROM and function post operatively compared to baselines.  Baseline: See objective measurements taken today. 03/06/2027     PLAN:  PT FREQUENCY/DURATION: EVAL and 1 follow up appointment.   PLAN FOR NEXT SESSION: will reassess 3-4 weeks post op to determine needs.   Patient will follow up at outpatient cancer rehab 3-4 weeks following surgery.  If the patient requires physical therapy at that time, a specific plan will be dictated and sent to the referring physician for approval. The patient was educated today on appropriate basic range of motion exercises to begin post operatively and the importance of viewing the After Breast Cancer class video following surgery.  Patient was educated today on lymphedema risk reduction practices as it pertains to recommendations that will benefit the patient immediately following surgery.  She verbalized good understanding.    Physical Therapy Information for After Breast Cancer Surgery/Treatment:  Lymphedema is a swelling condition that you may be at risk for in your arm if you have lymph nodes removed from the armpit area.  After a sentinel node biopsy, the risk is approximately 5-9% and is higher after an axillary node dissection.  There is treatment available for this condition and it is not life-threatening.  Contact your physician or physical therapist with concerns. You may begin the 4 shoulder/posture exercises (see additional sheet) when permitted by your physician (typically a week after surgery).  If you have drains, you may need to wait until those are removed before beginning range of motion exercises.  A general recommendation is to not lift your arms above shoulder height until drains are removed.  These exercises should be done to your tolerance and gently.  This is not a "no pain/no gain" type of recovery so listen to your body and stretch into the range of motion that you can tolerate, stopping if you have  pain.  If you are having immediate reconstruction, ask your plastic surgeon about doing exercises as he or she may want you to wait. We encourage you to attend the free one time ABC (After Breast Cancer) class offered by [facility].  You will learn information related to lymphedema risk, prevention and treatment and additional exercises to regain mobility following surgery.  You can call [phone] for more information.  This is offered the 1st and 3rd Monday of each month.  You only attend the class one time. While undergoing any medical procedure or treatment, try to avoid blood pressure being taken or needle sticks from occurring on the arm on the side of cancer.   This recommendation begins after surgery and continues for the rest of your life.  This may help reduce your risk of getting lymphedema (swelling in your arm). An excellent resource for those seeking information on lymphedema is the National Lymphedema Network's web site. It can be accessed at www.lymphnet.org If you  notice swelling in your hand, arm or breast at any time following surgery (even if it is many years from now), please contact your doctor or physical therapist to discuss this.  Lymphedema can be treated at any time but it is easier for you if it is treated early on.  If you feel like your shoulder motion is not returning to normal in a reasonable amount of time, please contact your surgeon or physical therapist.  [facility] [phone]. [address], [place] [address]  ABC CLASS After Breast Cancer Class  After Breast Cancer Class is a specially designed exercise class video to assist you in a safe recover after having breast cancer surgery.  In this video you will learn how to get back to full function whether your drains were just removed or if you had surgery a month ago. The video can be viewed on this page:  https://www.boyd-meyer.org/ or on YouTube here: https://youtu.GG/[email or web address].  Class Goals  Understand specific stretches to improve the flexibility of you chest and shoulder. Learn ways to safely strengthen your upper body and improve your posture. Understand the warning signs of infection and why you may be at risk for an arm infection. Learn about Lymphedema and prevention.  ** You do not need to view this video until after surgery.  Drains should be removed to participate in the recommended exercises on the video.  Patient was instructed today in a home exercise program today for post op shoulder range of motion. These included active assist shoulder flexion in sitting, scapular retraction, wall walking with shoulder abduction, and hands behind head external rotation.  She was encouraged to do these twice a day, holding 3 seconds and repeating 5 times when permitted by her physician.  Bethann Punches, [place] 09/05/23 8:19 AM

## 2023-09-05 ENCOUNTER — Encounter: Payer: Self-pay | Admitting: Genetic Counselor

## 2023-09-05 ENCOUNTER — Encounter: Payer: Self-pay | Admitting: Physical Therapy

## 2023-09-05 ENCOUNTER — Other Ambulatory Visit: Payer: Self-pay

## 2023-09-06 ENCOUNTER — Encounter: Payer: Self-pay | Admitting: *Deleted

## 2023-09-06 ENCOUNTER — Telehealth: Payer: Self-pay | Admitting: *Deleted

## 2023-09-06 NOTE — Telephone Encounter (Signed)
 Left vm regarding [facility] from 09/04/23. Contact information provided for questions or needs.

## 2023-09-09 NOTE — Progress Notes (Signed)
 [facility] Spiritual Care Note  This note should display as a SPIRITUAL CARE encounter, not a social work Audiological scientist.   [address] [place], [place], [facility] Pager [phone] Voicemail [phone]

## 2023-09-10 ENCOUNTER — Inpatient Hospital Stay: Admitting: Licensed Clinical Social Worker

## 2023-09-10 ENCOUNTER — Telehealth: Payer: Self-pay | Admitting: Licensed Clinical Social Worker

## 2023-09-10 NOTE — Telephone Encounter (Signed)
 [facility] Clinical Social Work  Clinical Social Work was referred by spiritual care for assessment of psychosocial needs due to SDOH concerns.  Clinical Social Worker contacted patient by phone to offer support and assess for needs.   Pt was not available to speak at the moment and requested to call CSW back at another time. CSW provided direct contact information and pt will call back at her convenience.     Persais Ethridge E Artrice Kraker, LCSW  Clinical Social Worker [facility]

## 2023-09-10 NOTE — Progress Notes (Signed)
 [facility] Clinical Social Work  Clinical Social Work was referred by spiritual care for assessment of psychosocial needs.  Clinical Social Worker spoke with pt by phone to offer support and assess for needs.   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 She lives with her girlfriend, so has some help with costs, but her partner is also taking some time off to support her during appointments.  CSW reviewed potential assistance options with patient. The prumary ones she may qualify for are [employer] and the [employer]. [employer].  CSW sent information to pt via MyChart and mailed a paper copy of the [employer] application.  CSW informed pt of additional support services related to counseling and encouraged pt to call with any questions or concerns.   Donna Bacchi E Marca Gadsby, LCSW  Clinical Social Worker [facility]

## 2023-09-11 ENCOUNTER — Encounter: Payer: Self-pay | Admitting: General Practice

## 2023-09-11 NOTE — Progress Notes (Signed)
 [facility] Spiritual Care Note  Followed up with Donna [name] by phone. She reports feeling numb about her diagnosis and treatment plan, particularly noting that her younger sister died two years ago of a very unusual type of cancer, which naturally stirs up and complicates her response to her own clinical situation. Donna [name] states that she is very clear that she wants to avoid chemotherapy because of her sister's experience.  Though Donna [name] describes herself as "not much of a talker," she articulates her experience directly. She reports other past trauma that has helped her develop inward strength and resilience.  Provided empathic, reflective listening and opportunity to share and process her story. We plan to follow up by phone in ca two weeks for another Spiritual Care check-in. Donna [name] verbalized her appreciation for support and call.   [address] [place], [place], [facility] Pager [phone] Voicemail [phone]

## 2023-09-13 ENCOUNTER — Encounter: Payer: Self-pay | Admitting: Genetic Counselor

## 2023-09-13 ENCOUNTER — Telehealth: Payer: Self-pay | Admitting: Genetic Counselor

## 2023-09-13 DIAGNOSIS — Z1379 Encounter for other screening for genetic and chromosomal anomalies: Secondary | ICD-10-CM | POA: Insufficient documentation

## 2023-09-13 NOTE — Telephone Encounter (Addendum)
 Disclosed negative breast cancer STAT genetics panel.  Pan-cancer panel is pending  Patient also reported additional FH-- MGM w/ contralateral breast cancer (first dx <50; second dx in 59s)

## 2023-09-17 ENCOUNTER — Other Ambulatory Visit: Payer: Self-pay | Admitting: Hematology and Oncology

## 2023-09-17 ENCOUNTER — Telehealth: Payer: Self-pay | Admitting: *Deleted

## 2023-09-17 ENCOUNTER — Encounter: Payer: Self-pay | Admitting: *Deleted

## 2023-09-17 ENCOUNTER — Ambulatory Visit: Payer: Self-pay | Admitting: Genetic Counselor

## 2023-09-17 DIAGNOSIS — Z1379 Encounter for other screening for genetic and chromosomal anomalies: Secondary | ICD-10-CM

## 2023-09-17 DIAGNOSIS — C50412 Malignant neoplasm of upper-outer quadrant of left female breast: Secondary | ICD-10-CM

## 2023-09-17 DIAGNOSIS — Z803 Family history of malignant neoplasm of breast: Secondary | ICD-10-CM

## 2023-09-17 MED ORDER — TAMOXIFEN CITRATE 20 MG PO TABS: 20.0000 mg | ORAL_TABLET | Freq: Every day | ORAL | 3 refills | Status: DC

## 2023-09-17 NOTE — Telephone Encounter (Signed)
 Spoke to pt regarding treatment care plan. Pt still working thru cancer dx as well as continuing to work. Pt expressed concern for loss of job. Pt will be eligible for all benefits within 3 months. Request an alternative if possible to hold on surgery until pt has met 1 yr mark at her employer. Discussed pt concern with Dr. Gudena. Discussed with pt the ability to start with tamoxifen prior to surgery with the possibility of sq herceptin d/t pt need to continue work with the least amount of interuptions to work. Pt agree to start tamoxifen and will be willing to discuss sq herceptin with Dr. Lee Public. Appt scheduled for 4/17 at 2pm via telephone office visit. Pt very appreciative for the discussion and willingness to work with her on her treatment plan. Denies further questions or concerns at this time. Encourage pt to call with needs.

## 2023-09-17 NOTE — Progress Notes (Signed)
 START OFF PATHWAY REGIMEN - Breast   [id number]:Trastuzumab and hyaluronidase-oysk 600 mg/10,000 units SUBQ D1 q21 Days:   A cycle is every 21 days:     Trastuzumab and hyaluronidase-oysk   **Always confirm dose/schedule in your pharmacy ordering system**  Patient Characteristics: Preoperative or Nonsurgical Candidate, M0 (Clinical Staging), Up to cT4c, Any N, M0, Neoadjuvant Therapy followed by Surgery, Invasive Disease, Chemotherapy, HER2 Positive, ER Positive Therapeutic Status: Preoperative or Nonsurgical Candidate, M0 (Clinical Staging) AJCC M Category: cM0 AJCC Grade: G2 ER Status: Positive (+) AJCC 8 Stage Grouping: IB HER2 Status: Positive (+) AJCC T Category: cT2 AJCC N Category: cN0 PR Status: Positive (+) Breast Surgical Plan: Neoadjuvant Therapy followed by Surgery Intent of Therapy: Curative Intent, Discussed with Patient

## 2023-09-17 NOTE — Progress Notes (Signed)
 HPI:   Donna [name] was previously seen in the [facility] due to a personal history of breast cancer and concerns regarding a hereditary predisposition to cancer.    Donna [name] recent genetic test results were disclosed to her by telephone. These results and recommendations are discussed in more detail below.  CANCER HISTORY:  Oncology History  Malignant neoplasm of upper-outer quadrant of left breast in female, estrogen receptor positive (HCC)  08/23/2023 Initial Diagnosis   Palpable left breast mass at 2 o'clock position measuring 3.1 cm, 1 axillary lymph node biopsy: Concordant, D density breast, biopsy: Grade 2 IDC ER 90%, PR 95%, Ki67 30%, HER2 3+ positive   09/04/2023 Cancer Staging   Staging form: Breast, AJCC 8th Edition - Clinical: Stage IB (cT2, cN0, cM0, G2, ER+, PR+, HER2+) - Signed by Cameron Cea, MD on 09/04/2023 Stage prefix: Initial diagnosis Histologic grading system: 3 grade system   09/12/2023 Genetic Testing   Negative [facility] CancerNext-Expanded +RNAinsight Panel.  Report date is 09/13/2023.   The CancerNext-Expanded gene panel offered by [facility] and includes sequencing, rearrangement, and RNA analysis for the following 76 genes: AIP, ALK, APC, ATM, AXIN2, BAP1, BARD1, BMPR1A, BRCA1, BRCA2, BRIP1, CDC73, CDH1, CDK4, CDKN1B, CDKN2A, CEBPA, CHEK2, CTNNA1, DDX41, DICER1, ETV6, FH, FLCN, GATA2, LZTR1, MAX, MBD4, MEN1, MET, MLH1, MSH2, MSH3, MSH6, MUTYH, NF1, NF2, NTHL1, PALB2, PHOX2B, PMS2, POT1, PRKAR1A, PTCH1, PTEN, RAD51C, RAD51D, RB1, RET, RUNX1, SDHA, SDHAF2, SDHB, SDHC, SDHD, SMAD4, SMARCA4, SMARCB1, SMARCE1, STK11, SUFU, TMEM127, TP53, TSC1, TSC2, VHL, and WT1 (sequencing and deletion/duplication); EGFR, HOXB13, KIT, MITF, PDGFRA, POLD1, and POLE (sequencing only); EPCAM and GREM1 (deletion/duplication only).        FAMILY HISTORY:  We obtained a detailed, 4-generation family history.  Significant diagnoses are listed below: Family  History  Problem Relation Age of Onset   Breast cancer Maternal Grandmother        contralateral; first dx <50; second dx in 67s   Cancer - Other Half-Sister 24       sarcoma; d. 85    Donna [name] is unaware of previous family history of genetic testing for hereditary cancer risks.  Other relatives are unavailable for genetic testing at this time.   She has limited information about paternal relatives.    There is no reported Ashkenazi Jewish ancestry. There is no known consanguinity.  GENETIC TEST RESULTS:  The [facility] CancerNext-Expanded +RNAinsight Panel found no pathogenic mutations.   The CancerNext-Expanded gene panel offered by [facility] and includes sequencing, rearrangement, and RNA analysis for the following 76 genes: AIP, ALK, APC, ATM, AXIN2, BAP1, BARD1, BMPR1A, BRCA1, BRCA2, BRIP1, CDC73, CDH1, CDK4, CDKN1B, CDKN2A, CEBPA, CHEK2, CTNNA1, DDX41, DICER1, ETV6, FH, FLCN, GATA2, LZTR1, MAX, MBD4, MEN1, MET, MLH1, MSH2, MSH3, MSH6, MUTYH, NF1, NF2, NTHL1, PALB2, PHOX2B, PMS2, POT1, PRKAR1A, PTCH1, PTEN, RAD51C, RAD51D, RB1, RET, RUNX1, SDHA, SDHAF2, SDHB, SDHC, SDHD, SMAD4, SMARCA4, SMARCB1, SMARCE1, STK11, SUFU, TMEM127, TP53, TSC1, TSC2, VHL, and WT1 (sequencing and deletion/duplication); EGFR, HOXB13, KIT, MITF, PDGFRA, POLD1, and POLE (sequencing only); EPCAM and GREM1 (deletion/duplication only).   The test report has been scanned into EPIC and is located under the Molecular Pathology section of the Results Review tab.  A portion of the [name] report is included below for reference. Genetic testing reported out on September 13, 2023.       Even though a pathogenic variant was not identified, possible explanations for the cancer in the family may include: There may be no hereditary risk  for cancer in the family. The cancers in Donna [name] and/or her family may be sporadic/familial or due to other genetic and environmental factors.  Most cancer is not hereditary.  There may be  a gene mutation in one of these genes that current testing methods cannot detect but that chance is small. There could be another gene that has not yet been discovered, or that we have not yet tested, that is responsible for the cancer diagnoses in the family.  It is also possible there is a hereditary cause for the cancer in the family that Donna [name] did not inherit.   Therefore, it is important to remain in touch with cancer genetics in the future so that we can continue to offer Donna [name] the most up to date genetic testing.    ADDITIONAL GENETIC TESTING:   Donna [name] genetic testing was fairly extensive.  If there are additional relevant genes identified to increase cancer risk that can be analyzed in the future, we would be happy to discuss and coordinate this testing at that time.     CANCER SCREENING RECOMMENDATIONS:  Donna [name] test [name] is considered negative (normal).  This means that we have not identified a hereditary cause for her personal history of breast cancer at this time.   An individual's cancer risk and medical management are not determined by genetic test results alone. Overall cancer risk assessment incorporates additional factors, including personal medical history, family history, and any available genetic information that may [name] in a personalized plan for cancer prevention and surveillance. Therefore, it is recommended she continue to follow the cancer management and screening guidelines provided by her oncology and primary healthcare provider.   RECOMMENDATIONS FOR FAMILY MEMBERS:   Individuals in this family might be at some increased risk of developing cancer, over the general population risk, due to the family history of cancer.  Individuals in the family should notify their providers of the family history of cancer. We recommend women in this family have a yearly mammogram beginning at age 70, or 42 years younger than the earliest onset of cancer,  an annual clinical breast exam, and perform monthly breast self-exams.  Risk models that take into account family history and hormonal history may be helpful in determining appropriate breast cancer screening options for family members.  Other members of the family may still carry a pathogenic variant in one of these genes that Donna [name] did not inherit. Based on the family history, we recommend her maternal grandmother, if still living, have genetic counseling/testing or, if she is deceased,close relatives of her maternal grandmother should have genetic counseling/testing.    FOLLOW-UP:  Cancer genetics is a rapidly advancing field and it is possible that new genetic tests will be appropriate for her and/or her family members in the future. We encourage Donna [name] to remain in contact with cancer genetics, so we can update her personal and family histories and let her know of advances in cancer genetics that may benefit this family.   Our contact number was provided.  They are welcome to call us  at anytime with additional questions or concerns.   Aviana Shevlin M. Ora Billing, MS, [facility] Genetic Counselor [email or web address] (P) [phone]

## 2023-09-17 NOTE — Telephone Encounter (Signed)
 Disclosed negative pan-cancer panel.    Patient also had several questions regarding surgery scheduling, FLMA paperwork, and pain after bx.  Message sent to nurse navigators

## 2023-09-17 NOTE — Progress Notes (Signed)
 Because there is a substantial delay in her surgery because of insurance reasons, I recommended that we start treatment with tamoxifen along with Herceptin.  Since she does not have a port I would like to do Herceptin Hylecta.

## 2023-09-18 ENCOUNTER — Telehealth: Payer: Self-pay | Admitting: Hematology and Oncology

## 2023-09-18 NOTE — Telephone Encounter (Signed)
 Spoke with patient confirming upcoming appointment

## 2023-09-19 ENCOUNTER — Other Ambulatory Visit: Payer: Self-pay

## 2023-09-19 ENCOUNTER — Other Ambulatory Visit: Payer: Self-pay | Admitting: Surgery

## 2023-09-19 ENCOUNTER — Inpatient Hospital Stay (HOSPITAL_BASED_OUTPATIENT_CLINIC_OR_DEPARTMENT_OTHER): Admitting: Hematology and Oncology

## 2023-09-19 DIAGNOSIS — C50912 Malignant neoplasm of unspecified site of left female breast: Secondary | ICD-10-CM

## 2023-09-19 DIAGNOSIS — Z170 Estrogen receptor positive status [ER+]: Secondary | ICD-10-CM | POA: Diagnosis not present

## 2023-09-19 DIAGNOSIS — C50412 Malignant neoplasm of upper-outer quadrant of left female breast: Secondary | ICD-10-CM

## 2023-09-19 NOTE — Progress Notes (Signed)
 HEMATOLOGY-ONCOLOGY TELEPHONE VISIT PROGRESS NOTE  I connected with our patient on 09/19/23 at  2:00 PM EDT by telephone and verified that I am speaking with the correct person using two identifiers.  I discussed the limitations, risks, security and privacy concerns of performing an evaluation and management service by telephone and the availability of in person appointments.  I also discussed with the patient that there may be a patient responsible charge related to this service. The patient expressed understanding and agreed to proceed.   History of Present Illness: Telephone follow-up to discuss treatment plan  History of Present Illness The patient, with a recent diagnosis of breast cancer, is currently considering treatment options. 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 She is also considering a double mastectomy as an alternative to chemotherapy. The patient is also considering an injection of Herceptin, a non-chemotherapy treatment given every three weeks, which has minimal side effects. She expresses a lack of trust in the medical industry and is seeking reassurance and guidance in making her treatment decisions.    Oncology History  Malignant neoplasm of upper-outer quadrant of left breast in female, estrogen receptor positive (HCC)  08/23/2023 Initial Diagnosis   Palpable left breast mass at 2 o'clock position measuring 3.1 cm, 1 axillary lymph node biopsy: Concordant, D density breast, biopsy: Grade 2 IDC ER 90%, PR 95%, Ki67 30%, HER2 3+ positive   09/04/2023 Cancer Staging   Staging form: Breast, AJCC 8th Edition - Clinical: Stage IB (cT2, cN0, cM0, G2, ER+, PR+, HER2+) - Signed by Cameron Cea, MD on 09/04/2023 Stage prefix: Initial diagnosis Histologic grading system: 3 grade system   09/12/2023 Genetic Testing   Negative [facility] CancerNext-Expanded +RNAinsight Panel.  Report  date is 09/13/2023.   The CancerNext-Expanded gene panel offered by [facility] and includes sequencing, rearrangement, and RNA analysis for the following 76 genes: AIP, ALK, APC, ATM, AXIN2, BAP1, BARD1, BMPR1A, BRCA1, BRCA2, BRIP1, CDC73, CDH1, CDK4, CDKN1B, CDKN2A, CEBPA, CHEK2, CTNNA1, DDX41, DICER1, ETV6, FH, FLCN, GATA2, LZTR1, MAX, MBD4, MEN1, MET, MLH1, MSH2, MSH3, MSH6, MUTYH, NF1, NF2, NTHL1, PALB2, PHOX2B, PMS2, POT1, PRKAR1A, PTCH1, PTEN, RAD51C, RAD51D, RB1, RET, RUNX1, SDHA, SDHAF2, SDHB, SDHC, SDHD, SMAD4, SMARCA4, SMARCB1, SMARCE1, STK11, SUFU, TMEM127, TP53, TSC1, TSC2, VHL, and WT1 (sequencing and deletion/duplication); EGFR, HOXB13, KIT, MITF, PDGFRA, POLD1, and POLE (sequencing only); EPCAM and GREM1 (deletion/duplication only).    10/11/2023 -  Chemotherapy   Patient is on Treatment Plan : BREAST MAINTENANCE Trastuzumab IV (6) or SQ (600) D1 q21d X 11 Cycles       REVIEW OF SYSTEMS:   Constitutional: Denies fevers, chills or abnormal weight loss All other systems were reviewed with the patient and are negative. Observations/Objective:     Assessment Plan:  Malignant neoplasm of upper-outer quadrant of left breast in female, estrogen receptor positive (HCC) 08/23/2023:Palpable left breast mass at 2 o'clock position measuring 3.1 cm, 1 axillary lymph node biopsy: Concordant, D density breast, biopsy: Grade 2 IDC ER 90%, PR 95%, Ki67 30%, HER2 3+ positive   Recommendation  1. Neoadjuvant chemotherapy with TCH Perjeta 6 cycles followed by Herceptin Perjeta maintenance versus Kadcyla maintenance (based on response to neoadjuvant chemo) for 1 year (patient decided to not proceed with chemo because of fear of losing employment), patient is starting tamoxifen with Herceptin injections on 10/11/2023 2. Followed by breast conserving surgery if possible with sentinel lymph node study 3. Followed  by adjuvant radiation therapy if patient had lumpectomy Genetic  testing ----------------------------------------------------------------------- New treatment plan: Herceptin injections every [redacted] weeks along with tamoxifen as neoadjuvant treatment. 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  Tamoxifen counseling: We discussed the risks and benefits of tamoxifen. These include but not limited to insomnia, hot flashes, mood changes, vaginal dryness, and weight gain. Although rare, serious side effects including endometrial cancer, risk of blood clots were also discussed. We strongly believe that the benefits far outweigh the risks. Patient understands these risks and consented to starting treatment.    She will come once every 3 weeks for Herceptin injections.  Patient is contemplating on whether or not to do TCHP neoadjuvant chemo but because of her employment, she is not entirely clear.  We will discuss this on May 9 when she comes back to see us .  I discussed the assessment and treatment plan with the patient. The patient was provided an opportunity to ask questions and all were answered. The patient agreed with the plan and demonstrated an understanding of the instructions. The patient was advised to call back or seek an in-person evaluation if the symptoms worsen or if the condition fails to improve as anticipated.   I provided 20 minutes of non-face-to-face time during this encounter.  This includes time for charting and coordination of care   Margert Sheerer, MD

## 2023-09-19 NOTE — Assessment & Plan Note (Signed)
 08/23/2023:Palpable left breast mass at 2 o'clock position measuring 3.1 cm, 1 axillary lymph node biopsy: Concordant, D density breast, biopsy: Grade 2 IDC ER 90%, PR 95%, Ki67 30%, HER2 3+ positive   Recommendation  1. Neoadjuvant chemotherapy with TCH Perjeta 6 cycles followed by Herceptin Perjeta maintenance versus Kadcyla maintenance (based on response to neoadjuvant chemo) for 1 year (patient decided to not proceed with chemo because of fear of losing employment) 2. Followed by breast conserving surgery if possible with sentinel lymph node study 3. Followed by adjuvant radiation therapy if patient had lumpectomy Genetic testing ----------------------------------------------------------------------- New treatment plan: Herceptin injections every [redacted] weeks along with tamoxifen as neoadjuvant treatment. 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  Tamoxifen counseling: We discussed the risks and benefits of tamoxifen. These include but not limited to insomnia, hot flashes, mood changes, vaginal dryness, and weight gain. Although rare, serious side effects including endometrial cancer, risk of blood clots were also discussed. We strongly believe that the benefits far outweigh the risks. Patient understands these risks and consented to starting treatment.    She will come once every 3 weeks for Herceptin injections.

## 2023-09-20 ENCOUNTER — Encounter: Payer: Self-pay | Admitting: *Deleted

## 2023-09-20 ENCOUNTER — Telehealth: Payer: Self-pay | Admitting: Hematology and Oncology

## 2023-09-20 DIAGNOSIS — C50412 Malignant neoplasm of upper-outer quadrant of left female breast: Secondary | ICD-10-CM

## 2023-09-20 NOTE — Telephone Encounter (Signed)
 Confirmed with pt scheduled appt date and time

## 2023-09-23 ENCOUNTER — Telehealth: Payer: Self-pay | Admitting: Hematology and Oncology

## 2023-09-23 ENCOUNTER — Encounter: Payer: Self-pay | Admitting: *Deleted

## 2023-09-23 NOTE — Telephone Encounter (Signed)
 Spoke with patient confirming upcoming appointment

## 2023-09-24 ENCOUNTER — Other Ambulatory Visit: Payer: Self-pay

## 2023-09-25 ENCOUNTER — Encounter: Payer: Self-pay | Admitting: General Practice

## 2023-09-25 NOTE — Progress Notes (Signed)
 [facility] Spiritual Care Note  Left supportive follow-up voicemail with direct number, encouraging return call. Plan to see in infusion, as well, if she decides on chemotherapy as currently scheduled.   [address] Donna [name], [place], [facility] Pager [phone] Voicemail [phone]

## 2023-09-26 ENCOUNTER — Other Ambulatory Visit: Payer: Self-pay

## 2023-10-07 NOTE — Progress Notes (Signed)
 Pharmacist Chemotherapy Monitoring - Initial Assessment    Anticipated start date: 10/14/23   The following has been reviewed per standard work regarding the patient's treatment regimen: The patient's diagnosis, treatment plan and drug doses, and organ/hematologic function Lab orders and baseline tests specific to treatment regimen  The treatment plan start date, drug sequencing, and pre-medications Prior authorization status  Patient's documented medication list, including drug-drug interaction screen and prescriptions for anti-emetics and supportive care specific to the treatment regimen The drug concentrations, fluid compatibility, administration routes, and timing of the medications to be used The patient's access for treatment and lifetime cumulative dose history, if applicable  The patient's medication allergies and previous infusion related reactions, if applicable   Changes made to treatment plan:  N/A  Follow up needed:  Pending authorization for treatment  ECHO scheduled 10/11/23   Cherylynn Cosier, [facility], 10/07/2023  12:38 PM

## 2023-10-09 ENCOUNTER — Inpatient Hospital Stay

## 2023-10-09 ENCOUNTER — Inpatient Hospital Stay: Attending: Hematology and Oncology | Admitting: Pharmacist

## 2023-10-09 DIAGNOSIS — Z5112 Encounter for antineoplastic immunotherapy: Secondary | ICD-10-CM | POA: Insufficient documentation

## 2023-10-09 DIAGNOSIS — Z1721 Progesterone receptor positive status: Secondary | ICD-10-CM | POA: Insufficient documentation

## 2023-10-09 DIAGNOSIS — C50412 Malignant neoplasm of upper-outer quadrant of left female breast: Secondary | ICD-10-CM | POA: Insufficient documentation

## 2023-10-09 DIAGNOSIS — Z170 Estrogen receptor positive status [ER+]: Secondary | ICD-10-CM

## 2023-10-09 DIAGNOSIS — F17210 Nicotine dependence, cigarettes, uncomplicated: Secondary | ICD-10-CM | POA: Insufficient documentation

## 2023-10-09 DIAGNOSIS — Z1731 Human epidermal growth factor receptor 2 positive status: Secondary | ICD-10-CM | POA: Insufficient documentation

## 2023-10-09 DIAGNOSIS — R0781 Pleurodynia: Secondary | ICD-10-CM | POA: Insufficient documentation

## 2023-10-09 NOTE — Progress Notes (Signed)
 [facility]       Telephone: [phone]?Fax: [phone]   Oncology Clinical Pharmacist Practitioner Initial Assessment  Donna [name] is a 33 y.o. female with a diagnosis of breast cancer. They were contacted today via telephone visit.  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 Dr. Gudena is trying to get Herceptin Hylecta (trastuzumab and hyaluronidase) but the authorization is currently pending. Prior auth team will update Dr. Gudena as patient is scheduled for IV trastuzumab on 10/14/23. This was all explained to the patient today. She understands the risks of not following guideline recommended care for her breast cancer and accepts the risk.   She will have her ECHO on 10/11/23 and have labs, visit with Donna Baars NP, and trastuzumab on 10/14/23 tentatively. She will start tamoxifen  once she gets home tomorrow. She has been traveling out of state for her job.  Indication/Regimen Trastuzumab (Herceptin) is being used appropriately for treatment of breast cancer in the neoadjuvant setting by Dr. Vinay Gudena.      Wt Readings from Last 1 Encounters:  09/04/23 223 lb 6.4 oz (101.3 kg)    Estimated body surface area is 2.33 meters squared as calculated from the following:   Height as of 09/04/23: 6\' 4"  (1.93 m).   Weight as of 09/04/23: 223 lb 6.4 oz (101.3 kg).  The dosing regimen is every 21 days for 6 cycles, then surgery  Trastuzumab (8 mg/kg loading dose, 6 mg/kg maintenance dose) IV on Day 1 -- no loading dose per Dr. Lee Public and all administrations over 30 minutes  Dose Modifications Dr. Lee Public will not do a loading dose and will keep all doses at 6 mg/kg over 30 minutes   Allergies No Known Allergies  Vitals: No vitals or labs were done today for this virtual chemotherapy education visit     09/04/2023    1:38 PM 10/10/2020    4:15 AM 10/10/2020    3:30 AM  Oncology Vitals  Height 193 cm     Weight 101.334 kg    Weight (lbs) 223 lbs 6 oz    BMI 27.19 kg/m2    Temp 97.2 F (36.2 C)    Pulse Rate 53 98 81  BP 104/65 125/85 120/72  Resp 16 22 26   SpO2 100 % 99 % 100 %  BSA (m2) 2.33 m2       Laboratory Data    Latest Ref Rng & Units 09/04/2023   12:33 PM 10/10/2020    3:45 AM 08/09/2019    6:25 PM  CBC EXTENDED  WBC 4.0 - 10.5 K/uL 5.7  8.6  4.8   RBC 3.87 - 5.11 MIL/uL 4.82  4.69  5.02   Hemoglobin 12.0 - 15.0 g/dL 57.8  46.9  62.9   HCT 36.0 - 46.0 % 41.6  42.4  45.0   Platelets 150 - 400 K/uL 209  167  172   NEUT# 1.7 - 7.7 K/uL 2.9     Lymph# 0.7 - 4.0 K/uL 1.7          Latest Ref Rng & Units 09/04/2023   12:33 PM 10/10/2020    3:45 AM 08/09/2019    6:25 PM  CMP  Glucose 70 - 99 mg/dL 87  528  89   BUN 6 - 20 mg/dL 8  6  9    Creatinine 0.44 - 1.00 mg/dL 4.13  2.44  0.10   Sodium 135 - 145  mmol/L 139  136  140   Potassium 3.5 - 5.1 mmol/L 4.4  3.6  4.1   Chloride 98 - 111 mmol/L 106  102  106   CO2 22 - 32 mmol/L 30  27  24    Calcium 8.9 - 10.3 mg/dL 9.2  8.8  9.7   Total Protein 6.5 - 8.1 g/dL 7.2     Total Bilirubin 0.0 - 1.2 mg/dL 0.5     Alkaline Phos 38 - 126 U/L 47     AST 15 - 41 U/L 16     ALT 0 - 44 U/L 13      Contraindications Contraindications were reviewed? Yes Contraindications to therapy were identified? No   Safety Precautions The following safety precautions for the use of trastuzumab were reviewed:  Fever: reviewed the importance of having a thermometer and the Centers for Disease Control and Prevention (CDC) definition of fever which is 100.86F (38C) or higher. Patient should call 24/7 triage at [phone] if experiencing a fever or any other symptoms Diarrhea Fatigue Headache Muscle or joint pain or weakness Nausea or vomiting Cardiotoxicity Infusion reactions Pneumonitis Handling body fluids and waste Intimacy, sexual activity, contraception, and fertility  Medication Reconciliation Current Outpatient Medications   Medication Sig Dispense Refill   tamoxifen  (NOLVADEX ) 20 MG tablet Take 1 tablet (20 mg total) by mouth daily. 90 tablet 3   No current facility-administered medications for this visit.   Medication reconciliation is based on the patient's most recent medication list in the electronic medical record (EMR) including herbal products and OTC medications.   The patient's medication list was reviewed today with the patient? Yes   Drug-drug interactions (DDIs) DDIs were evaluated? Yes Significant DDIs identified? Yes   Drug-Food Interactions Drug-food interactions were evaluated? Yes Drug-food interactions identified? No   Follow-up Plan  Treatment start date: tentatively 10/14/23, may switch to Herceptin Hylecta if approved. Prior authorization team notified and working on authorization. Dr. 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 Lee Public), premedications, and treatment regimen with the patient. Possible side effects of the treatment regimen were reviewed and management strategies were discussed.  Monitor for toxicities. Clinical pharmacy will assist Dr. Vinay Gudena and Kemper Mackert on an as needed basis going forward  Amorah Messing participated in the discussion, expressed understanding, and voiced agreement with the above plan. All questions were answered to her satisfaction. The patient was advised to contact the clinic at [phone] with any questions or concerns prior to her return visit.   I spent 30 minutes assessing the patient.  Lynann Demetrius A. Webb Hake, PharmD, BCOP, CPP  Althea Atkinson, RPH-CPP, 10/09/2023 11:42 AM  **Disclaimer: This note was dictated with voice recognition software. Similar sounding words can inadvertently be transcribed and this note  may contain transcription errors which may not have been corrected upon publication of note.**

## 2023-10-11 ENCOUNTER — Ambulatory Visit (HOSPITAL_COMMUNITY)
Admission: RE | Admit: 2023-10-11 | Discharge: 2023-10-11 | Disposition: A | Discharge status: Home / Self Care | Source: Ambulatory Visit | Attending: Hematology and Oncology

## 2023-10-11 ENCOUNTER — Other Ambulatory Visit: Payer: Self-pay | Admitting: *Deleted

## 2023-10-11 DIAGNOSIS — C50412 Malignant neoplasm of upper-outer quadrant of left female breast: Secondary | ICD-10-CM | POA: Diagnosis not present

## 2023-10-11 DIAGNOSIS — Z0189 Encounter for other specified special examinations: Secondary | ICD-10-CM | POA: Diagnosis not present

## 2023-10-11 DIAGNOSIS — Z170 Estrogen receptor positive status [ER+]: Secondary | ICD-10-CM | POA: Insufficient documentation

## 2023-10-11 DIAGNOSIS — Z01818 Encounter for other preprocedural examination: Secondary | ICD-10-CM | POA: Diagnosis present

## 2023-10-11 LAB — ECHOCARDIOGRAM COMPLETE
AR max vel: 2.12 cm2
AV Area VTI: 2.01 cm2
AV Area mean vel: 1.93 cm2
AV Mean grad: 4 mmHg
AV Peak grad: 8 mmHg
Ao pk vel: 1.41 m/s
Area-P 1/2: 3.63 cm2
Calc EF: 69.7 %
S' Lateral: 3.5 cm
Single Plane A2C EF: 72.7 %
Single Plane A4C EF: 67.6 %

## 2023-10-11 NOTE — Progress Notes (Signed)
*  PRELIMINARY RESULTS* Echocardiogram 2D Echocardiogram has been performed.  Fontaine Kossman D Lorrena Goranson 10/11/2023, 11:45 AM

## 2023-10-13 NOTE — Assessment & Plan Note (Signed)
 08/23/2023:Palpable left breast mass at 2 o'clock position measuring 3.1 cm, 1 axillary lymph node biopsy: Concordant, D density breast, biopsy: Grade 2 IDC ER 90%, PR 95%, Ki67 30%, HER2 3+ positive   Recommendation  1. Neoadjuvant chemotherapy with TCH Perjeta 6 cycles followed by Herceptin Perjeta maintenance versus Kadcyla maintenance (based on response to neoadjuvant chemo) for 1 year (patient decided to not proceed with chemo because of fear of losing employment) 2. Followed by breast conserving surgery if possible with sentinel lymph node study 3. Followed by adjuvant radiation therapy if patient had lumpectomy Genetic testing ----------------------------------------------------------------------- New treatment plan: Herceptin injections every [redacted] weeks along with tamoxifen  as neoadjuvant treatment. 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  Tamoxifen  counseling: We discussed the risks and benefits of tamoxifen . These include but not limited to insomnia, hot flashes, mood changes, vaginal dryness, and weight gain. Although rare, serious side effects including endometrial cancer, risk of blood clots were also discussed. We strongly believe that the benefits far outweigh the risks. Patient understands these risks and consented to starting treatment.    She will come once every 3 weeks for Herceptin injections.

## 2023-10-13 NOTE — Progress Notes (Unsigned)
 [facility] Cancer Follow up:    Patient, No Pcp Per No address on file   DIAGNOSIS: Cancer Staging  Malignant neoplasm of upper-outer quadrant of left breast in female, estrogen receptor positive (HCC) Staging form: Breast, AJCC 8th Edition - Clinical: Stage IB (cT2, cN0, cM0, G2, ER+, PR+, HER2+) - Signed by Cameron Cea, MD on 09/04/2023 Stage prefix: Initial diagnosis Histologic grading system: 3 grade system    SUMMARY OF ONCOLOGIC HISTORY: Oncology History  Malignant neoplasm of upper-outer quadrant of left breast in female, estrogen receptor positive (HCC)  08/23/2023 Initial Diagnosis   Palpable left breast mass at 2 o'clock position measuring 3.1 cm, 1 axillary lymph node biopsy: Concordant, D density breast, biopsy: Grade 2 IDC ER 90%, PR 95%, Ki67 30%, HER2 3+ positive   09/04/2023 Cancer Staging   Staging form: Breast, AJCC 8th Edition - Clinical: Stage IB (cT2, cN0, cM0, G2, ER+, PR+, HER2+) - Signed by Cameron Cea, MD on 09/04/2023 Stage prefix: Initial diagnosis Histologic grading system: 3 grade system   09/12/2023 Genetic Testing   Negative [facility] CancerNext-Expanded +RNAinsight Panel.  Report date is 09/13/2023.   The CancerNext-Expanded gene panel offered by [facility] and includes sequencing, rearrangement, and RNA analysis for the following 76 genes: AIP, ALK, APC, ATM, AXIN2, BAP1, BARD1, BMPR1A, BRCA1, BRCA2, BRIP1, CDC73, CDH1, CDK4, CDKN1B, CDKN2A, CEBPA, CHEK2, CTNNA1, DDX41, DICER1, ETV6, FH, FLCN, GATA2, LZTR1, MAX, MBD4, MEN1, MET, MLH1, MSH2, MSH3, MSH6, MUTYH, NF1, NF2, NTHL1, PALB2, PHOX2B, PMS2, POT1, PRKAR1A, PTCH1, PTEN, RAD51C, RAD51D, RB1, RET, RUNX1, SDHA, SDHAF2, SDHB, SDHC, SDHD, SMAD4, SMARCA4, SMARCB1, SMARCE1, STK11, SUFU, TMEM127, TP53, TSC1, TSC2, VHL, and WT1 (sequencing and deletion/duplication); EGFR, HOXB13, KIT, MITF, PDGFRA, POLD1, and POLE (sequencing only); EPCAM and GREM1 (deletion/duplication only).    10/11/2023 -   Chemotherapy   Patient is on Treatment Plan : BREAST MAINTENANCE Trastuzumab IV (6) or SQ (600) D1 q21d X 11 Cycles       CURRENT THERAPY: Tamoxifen /Herceptin  INTERVAL HISTORY:  Discussed the use of AI scribe software for clinical note transcription with the patient, who gave verbal consent to proceed.  Donna [name] 33 y.o. female returns for f/u prior to receiving Herceptin every 3 weeks.  Her most recent echo occurred on 10/11/2023 and demonstrated a LVEF of 60-65%.    Patient Active Problem List   Diagnosis Date Noted   Genetic testing 09/13/2023   Malignant neoplasm of upper-outer quadrant of left breast in female, estrogen receptor positive (HCC) 09/02/2023   Acute lateral meniscus tear of right knee 12/15/2019    has no known allergies.  MEDICAL HISTORY: Past Medical History:  Diagnosis Date   Breast cancer [facility])     SURGICAL HISTORY: Past Surgical History:  Procedure Laterality Date   BREAST BIOPSY Left 08/23/2023   US  LT BREAST BX W LOC DEV 1ST LESION IMG BX SPEC US  GUIDE 08/23/2023 [facility] MAMMOGRAPHY    SOCIAL HISTORY: Social History   Socioeconomic History   Marital status: Single    Spouse name: Not on file   Number of children: Not on file   Years of education: Not on file   Highest education level: Not on file  Occupational History   Not on file  Tobacco Use   Smoking status: Every Day    Current packs/day: 0.50    Average packs/day: 0.5 packs/day for 9.0 years (4.5 ttl pk-yrs)    Types: Cigars, Cigarettes   Smokeless tobacco: Never  Substance and Sexual Activity  Alcohol use: Yes    Alcohol/week: 0.0 standard drinks of alcohol    Comment: weekends   Drug use: No   Sexual activity: Not Currently    Birth control/protection: None  Other Topics Concern   Not on file  Social History Narrative   Not on file   Social Drivers of Health   Financial Resource Strain: Not on file  Food Insecurity: Food Insecurity Present (09/04/2023)   Hunger  Vital Sign    Worried About Running Out of Food in the Last Year: Sometimes true    Ran Out of Food in the Last Year: Sometimes true  Transportation Needs: No Transportation Needs (09/04/2023)   PRAPARE - Administrator, Civil Service (Medical): No    Lack of Transportation (Non-Medical): No  Physical Activity: Not on file  Stress: Not on file  Social Connections: Not on file  Intimate Partner Violence: Not At Risk (09/04/2023)   Humiliation, Afraid, Rape, and Kick questionnaire    Fear of Current or Ex-Partner: No    Emotionally Abused: No    Physically Abused: No    Sexually Abused: No    FAMILY HISTORY: Family History  Problem Relation Age of Onset   Breast cancer Maternal Grandmother        contralateral; first dx <50; second dx in 86s   Cancer - Other Half-Sister 48       sarcoma; d. 33    Review of Systems  Constitutional:  Negative for appetite change, chills, fatigue, fever and unexpected weight change.  HENT:   Negative for hearing loss, lump/mass and trouble swallowing.   Eyes:  Negative for eye problems and icterus.  Respiratory:  Negative for chest tightness, cough and shortness of breath.   Cardiovascular:  Negative for chest pain, leg swelling and palpitations.  Gastrointestinal:  Negative for abdominal distention, abdominal pain, constipation, diarrhea, nausea and vomiting.  Endocrine: Negative for hot flashes.  Genitourinary:  Negative for difficulty urinating.   Musculoskeletal:  Negative for arthralgias.  Skin:  Negative for itching and rash.  Neurological:  Negative for dizziness, extremity weakness, headaches and numbness.  Hematological:  Negative for adenopathy. Does not bruise/bleed easily.  Psychiatric/Behavioral:  Negative for depression. The patient is not nervous/anxious.       PHYSICAL EXAMINATION    There were no vitals filed for this visit.  Physical Exam Constitutional:      General: She is not in acute distress.     Appearance: Normal appearance. She is not toxic-appearing.  HENT:     Head: Normocephalic and atraumatic.     Mouth/Throat:     Mouth: Mucous membranes are moist.     Pharynx: Oropharynx is clear. No oropharyngeal exudate or posterior oropharyngeal erythema.  Eyes:     General: No scleral icterus. Cardiovascular:     Rate and Rhythm: Normal rate and regular rhythm.     Pulses: Normal pulses.     Heart sounds: Normal heart sounds.  Pulmonary:     Effort: Pulmonary effort is normal.     Breath sounds: Normal breath sounds.  Abdominal:     General: Abdomen is flat. Bowel sounds are normal. There is no distension.     Palpations: Abdomen is soft.     Tenderness: There is no abdominal tenderness.  Musculoskeletal:        General: No swelling.     Cervical back: Neck supple.  Lymphadenopathy:     Cervical: No cervical adenopathy.  Skin:  General: Skin is warm and dry.     Findings: No rash.  Neurological:     General: No focal deficit present.     Mental Status: She is alert.  Psychiatric:        Mood and Affect: Mood normal.        Behavior: Behavior normal.     LABORATORY DATA:  CBC    Component Value Date/Time   WBC 5.7 09/04/2023 1233   WBC 8.6 10/10/2020 0345   RBC 4.82 09/04/2023 1233   HGB 14.1 09/04/2023 1233   HCT 41.6 09/04/2023 1233   PLT 209 09/04/2023 1233   MCV 86.3 09/04/2023 1233   MCH 29.3 09/04/2023 1233   MCHC 33.9 09/04/2023 1233   RDW 12.3 09/04/2023 1233   LYMPHSABS 1.7 09/04/2023 1233   MONOABS 0.3 09/04/2023 1233   EOSABS 0.7 (H) 09/04/2023 1233   BASOSABS 0.1 09/04/2023 1233    CMP     Component Value Date/Time   NA 139 09/04/2023 1233   K 4.4 09/04/2023 1233   CL 106 09/04/2023 1233   CO2 30 09/04/2023 1233   GLUCOSE 87 09/04/2023 1233   BUN 8 09/04/2023 1233   CREATININE 0.85 09/04/2023 1233   CREATININE 0.92 08/29/2015 1605   CALCIUM 9.2 09/04/2023 1233   PROT 7.2 09/04/2023 1233   ALBUMIN 4.3 09/04/2023 1233   AST 16  09/04/2023 1233   ALT 13 09/04/2023 1233   ALKPHOS 47 09/04/2023 1233   BILITOT 0.5 09/04/2023 1233   GFRNONAA >60 09/04/2023 1233   GFRAA >60 08/09/2019 1825     ASSESSMENT and THERAPY PLAN:   No problem-specific Assessment & Plan notes found for this encounter.     All questions were answered. The patient knows to call the clinic with any problems, questions or concerns. We can certainly see the patient much sooner if necessary.  Total encounter time:*** minutes*in face-to-face visit time, chart review, lab review, care coordination, order entry, and documentation of the encounter time.    Alwin Baars, NP 10/13/23 8:50 PM Medical Oncology and Hematology [facility] [address] [place], [place] [address] Tel. [phone]    Fax. [phone]  *Total Encounter Time as defined by the Centers for Medicare and Medicaid Services includes, in addition to the face-to-face time of a patient visit (documented in the note above) non-face-to-face time: obtaining and reviewing outside history, ordering and reviewing medications, tests or procedures, care coordination (communications with other health care professionals or caregivers) and documentation in the medical record.

## 2023-10-14 ENCOUNTER — Inpatient Hospital Stay

## 2023-10-14 ENCOUNTER — Encounter: Payer: Self-pay | Admitting: *Deleted

## 2023-10-14 ENCOUNTER — Ambulatory Visit (HOSPITAL_COMMUNITY)
Admission: RE | Admit: 2023-10-14 | Discharge: 2023-10-14 | Disposition: A | Discharge status: Home / Self Care | Source: Ambulatory Visit | Attending: Adult Health | Admitting: Adult Health

## 2023-10-14 ENCOUNTER — Encounter: Payer: Self-pay | Admitting: General Practice

## 2023-10-14 ENCOUNTER — Other Ambulatory Visit: Payer: Self-pay | Admitting: Hematology and Oncology

## 2023-10-14 ENCOUNTER — Encounter: Payer: Self-pay | Admitting: Adult Health

## 2023-10-14 ENCOUNTER — Inpatient Hospital Stay (HOSPITAL_BASED_OUTPATIENT_CLINIC_OR_DEPARTMENT_OTHER): Admitting: Adult Health

## 2023-10-14 VITALS — BP 97/62 | HR 53 | Temp 98.1°F | Resp 16 | Ht 76.0 in | Wt 224.8 lb

## 2023-10-14 DIAGNOSIS — Z170 Estrogen receptor positive status [ER+]: Secondary | ICD-10-CM | POA: Insufficient documentation

## 2023-10-14 DIAGNOSIS — F17210 Nicotine dependence, cigarettes, uncomplicated: Secondary | ICD-10-CM | POA: Diagnosis not present

## 2023-10-14 DIAGNOSIS — Z1731 Human epidermal growth factor receptor 2 positive status: Secondary | ICD-10-CM | POA: Diagnosis not present

## 2023-10-14 DIAGNOSIS — R0781 Pleurodynia: Secondary | ICD-10-CM | POA: Diagnosis not present

## 2023-10-14 DIAGNOSIS — Z1721 Progesterone receptor positive status: Secondary | ICD-10-CM | POA: Diagnosis not present

## 2023-10-14 DIAGNOSIS — C50412 Malignant neoplasm of upper-outer quadrant of left female breast: Secondary | ICD-10-CM

## 2023-10-14 DIAGNOSIS — Z5112 Encounter for antineoplastic immunotherapy: Secondary | ICD-10-CM | POA: Diagnosis present

## 2023-10-14 LAB — CBC WITH DIFFERENTIAL (CANCER CENTER ONLY)
Abs Immature Granulocytes: 0.01 10*3/uL (ref 0.00–0.07)
Basophils Absolute: 0.1 10*3/uL (ref 0.0–0.1)
Basophils Relative: 1 %
Eosinophils Absolute: 0.9 10*3/uL — ABNORMAL HIGH (ref 0.0–0.5)
Eosinophils Relative: 13 %
HCT: 38.1 % (ref 36.0–46.0)
Hemoglobin: 13 g/dL (ref 12.0–15.0)
Immature Granulocytes: 0 %
Lymphocytes Relative: 30 %
Lymphs Abs: 2.3 10*3/uL (ref 0.7–4.0)
MCH: 28.8 pg (ref 26.0–34.0)
MCHC: 34.1 g/dL (ref 30.0–36.0)
MCV: 84.5 fL (ref 80.0–100.0)
Monocytes Absolute: 0.4 10*3/uL (ref 0.1–1.0)
Monocytes Relative: 5 %
Neutro Abs: 3.8 10*3/uL (ref 1.7–7.7)
Neutrophils Relative %: 51 %
Platelet Count: 176 10*3/uL (ref 150–400)
RBC: 4.51 MIL/uL (ref 3.87–5.11)
RDW: 12.2 % (ref 11.5–15.5)
WBC Count: 7.4 10*3/uL (ref 4.0–10.5)
nRBC: 0 % (ref 0.0–0.2)

## 2023-10-14 LAB — CMP (CANCER CENTER ONLY)
ALT: 33 U/L (ref 0–44)
AST: 25 U/L (ref 15–41)
Albumin: 3.9 g/dL (ref 3.5–5.0)
Alkaline Phosphatase: 45 U/L (ref 38–126)
Anion gap: 5 (ref 5–15)
BUN: 8 mg/dL (ref 6–20)
CO2: 25 mmol/L (ref 22–32)
Calcium: 8.3 mg/dL — ABNORMAL LOW (ref 8.9–10.3)
Chloride: 111 mmol/L (ref 98–111)
Creatinine: 0.95 mg/dL (ref 0.44–1.00)
GFR, Estimated: >60 mL/min (ref >60)
Glucose, Bld: 98 mg/dL (ref 70–99)
Potassium: 3.3 mmol/L — ABNORMAL LOW (ref 3.5–5.1)
Sodium: 141 mmol/L (ref 135–145)
Total Bilirubin: 0.3 mg/dL (ref 0.0–1.2)
Total Protein: 6.3 g/dL — ABNORMAL LOW (ref 6.5–8.1)

## 2023-10-14 LAB — PREGNANCY, URINE: Preg Test, Ur: NEGATIVE

## 2023-10-14 MED ORDER — TRASTUZUMAB-ANNS CHEMO 150 MG IV SOLR
8.0000 mg/kg | Freq: Once | INTRAVENOUS | Status: AC
  Administered 2023-10-14: 840 mg via INTRAVENOUS
  Filled 2023-10-14: qty 40

## 2023-10-14 MED ORDER — SODIUM CHLORIDE 0.9 % IV SOLN: INTRAVENOUS | Status: DC

## 2023-10-14 MED ORDER — ACETAMINOPHEN 325 MG PO TABS
650.0000 mg | ORAL_TABLET | Freq: Once | ORAL | Status: AC
Start: 2023-10-14 — End: 2023-10-14
  Administered 2023-10-14: 650 mg via ORAL
  Filled 2023-10-14: qty 2

## 2023-10-14 MED ORDER — LORAZEPAM 2 MG/ML IJ SOLN
0.5000 mg | Freq: Once | INTRAMUSCULAR | Status: AC
  Administered 2023-10-14: 0.5 mg via INTRAVENOUS
  Filled 2023-10-14: qty 1

## 2023-10-14 MED ORDER — LORAZEPAM 2 MG/ML IJ SOLN
0.5000 mg | Freq: Once | INTRAMUSCULAR | Status: AC
Start: 2023-10-14 — End: 2023-10-14
  Administered 2023-10-14: 0.5 mg via INTRAVENOUS
  Filled 2023-10-14: qty 1

## 2023-10-14 MED ORDER — DIPHENHYDRAMINE HCL 25 MG PO CAPS
25.0000 mg | ORAL_CAPSULE | Freq: Once | ORAL | Status: AC
  Administered 2023-10-14: 25 mg via ORAL
  Filled 2023-10-14: qty 1

## 2023-10-14 NOTE — Progress Notes (Signed)
 Loading dose of Kanjiniti today 8mg /kg

## 2023-10-14 NOTE — Progress Notes (Signed)
 Pt anxious about starting treatment. Pt given 0.5mg  ativan for anxiety with no effect. Additionaly 0.5mg  ativan given 30 min later and patient was able to sleep. At discharge, pt stated she was less anxious.

## 2023-10-14 NOTE — Progress Notes (Signed)
 [facility] Spiritual Care Note  Followed up with [name] in infusion, recognizing that this is a part of her treatment plan that she dearly wanted to avoid. Brought a handmade blessing blanket as a tangible gesture of support and encouragement, as well as verbal affirmation of her courage through the process.  We plan to follow up at a future treatment, and she has direct spiritual care number in case needs arise/circumstances change.   [address] Dorice Gardner, [place], [facility] Pager [phone] Voicemail [phone]

## 2023-10-14 NOTE — Patient Instructions (Addendum)
 [facility] - A DEPT OF [facility]  Discharge Instructions: Thank you for choosing [facility] to provide your oncology and hematology care.   If you have a lab appointment with the Cancer Center, please go directly to the Cancer Center and check in at the registration area.   Wear comfortable clothing and clothing appropriate for easy access to any Portacath or PICC line.   We strive to give you quality time with your provider. You may need to reschedule your appointment if you arrive late (15 or more minutes).  Arriving late affects you and other patients whose appointments are after yours.  Also, if you miss three or more appointments without notifying the office, you may be dismissed from the clinic at the provider's discretion.      For prescription refill requests, have your pharmacy contact our office and allow 72 hours for refills to be completed.    Today you received the following chemotherapy and/or immunotherapy agents: Trastuzumab      To help prevent nausea and vomiting after your treatment, we encourage you to take your nausea medication as directed.  BELOW ARE SYMPTOMS THAT SHOULD BE REPORTED IMMEDIATELY: *FEVER GREATER THAN 100.4 F (38 C) OR HIGHER *CHILLS OR SWEATING *NAUSEA AND VOMITING THAT IS NOT CONTROLLED WITH YOUR NAUSEA MEDICATION *UNUSUAL SHORTNESS OF BREATH *UNUSUAL BRUISING OR BLEEDING *URINARY PROBLEMS (pain or burning when urinating, or frequent urination) *BOWEL PROBLEMS (unusual diarrhea, constipation, pain near the anus) TENDERNESS IN MOUTH AND THROAT WITH OR WITHOUT PRESENCE OF ULCERS (sore throat, sores in mouth, or a toothache) UNUSUAL RASH, SWELLING OR PAIN  UNUSUAL VAGINAL DISCHARGE OR ITCHING   Items with * indicate a potential emergency and should be followed up as soon as possible or go to the Emergency Department if any problems should occur.  Please show the CHEMOTHERAPY ALERT CARD or  IMMUNOTHERAPY ALERT CARD at check-in to the Emergency Department and triage nurse.  Should you have questions after your visit or need to cancel or reschedule your appointment, please contact [facility]  Dept: [phone]  and follow the prompts.  Office hours are 8:00 a.m. to 4:30 p.m. Monday - Friday. Please note that voicemails left after 4:00 p.m. may not be returned until the following business day.  We are closed weekends and major holidays. You have access to a nurse at all times for urgent questions. Please call the main number to the clinic Dept: [phone] and follow the prompts.   For any non-urgent questions, you may also contact your provider using MyChart. We now offer e-Visits for anyone 80 and older to request care online for non-urgent symptoms. For details visit [email or web address].   Also download the MyChart app! Go to the app store, search "MyChart", open the app, select [place], and log in with your MyChart username and password.  Trastuzumab Injection What is this medication? TRASTUZUMAB (tras TOO zoo mab) treats breast cancer and stomach cancer. It works by blocking a protein that causes cancer cells to grow and multiply. This helps to slow or stop the spread of cancer cells. This medicine may be used for other purposes; ask your health care provider or pharmacist if you have questions. COMMON BRAND NAME(S): Herceptin, HERCESSI, Herzuma, KANJINTI, Ogivri, Ontruzant, Trazimera What should I tell my care team before I take this medication? They need to know if you have any of  these conditions: Heart failure Lung disease An unusual or allergic reaction to trastuzumab, other medications, foods, dyes, or preservatives Pregnant or trying to get pregnant Breast-feeding How should I use this medication? This medication is injected into a vein. It is given by your care team in a hospital or clinic setting. Talk  to your care team about the use of this medication in children. It is not approved for use in children. Overdosage: If you think you have taken too much of this medicine contact a poison control center or emergency room at once. NOTE: This medicine is only for you. Do not share this medicine with others. What if I miss a dose? Keep appointments for follow-up doses. It is important not to miss your dose. Call your care team if you are unable to keep an appointment. What may interact with this medication? Certain types of chemotherapy, such as daunorubicin, doxorubicin, epirubicin, idarubicin This list may not describe all possible interactions. Give your health care provider a list of all the medicines, herbs, non-prescription drugs, or dietary supplements you use. Also tell them if you smoke, drink alcohol, or use illegal drugs. Some items may interact with your medicine. What should I watch for while using this medication? Your condition will be monitored carefully while you are receiving this medication. This medication may make you feel generally unwell. This is not uncommon, as chemotherapy affects healthy cells as well as cancer cells. Report any side effects. Continue your course of treatment even though you feel ill unless your care team tells you to stop. This medication may increase your risk of getting an infection. Call your care team for advice if you get a fever, chills, sore throat, or other symptoms of a cold or flu. Do not treat yourself. Try to avoid being around people who are sick. Avoid taking medications that contain aspirin, acetaminophen, ibuprofen, naproxen, or ketoprofen unless instructed by your care team. These medications can hide a fever. Talk to your care team if you may be pregnant. Serious birth defects can occur if you take this medication during pregnancy and for 7 months after the last dose. You will need a negative pregnancy test before starting this medication.  Contraception is recommended while taking this medication and for 7 months after the last dose. Your care team can help you find the option that works for you. Do not breastfeed while taking this medication and for 7 months after stopping treatment. What side effects may I notice from receiving this medication? Side effects that you should report to your care team as soon as possible: Allergic reactions or angioedema--skin rash, itching or hives, swelling of the face, eyes, lips, tongue, arms, or legs, trouble swallowing or breathing Dry cough, shortness of breath or trouble breathing Heart failure--shortness of breath, swelling of the ankles, feet, or hands, sudden weight gain, unusual weakness or fatigue Infection--fever, chills, cough, or sore throat Infusion reactions--chest pain, shortness of breath or trouble breathing, feeling faint or lightheaded Side effects that usually do not require medical attention (report to your care team if they continue or are bothersome): Diarrhea Dizziness Headache Nausea Trouble sleeping Vomiting This list may not describe all possible side effects. Call your doctor for medical advice about side effects. You may report side effects to FDA at 1-800-FDA-1088. Where should I keep my medication? This medication is given in a hospital or clinic. It will not be stored at home. NOTE: This sheet is a summary. It may not cover all possible information.  If you have questions about this medicine, talk to your doctor, pharmacist, or health care provider.  2024 Elsevier/Gold Standard (2021-10-03 00:00:00)

## 2023-10-15 ENCOUNTER — Telehealth: Payer: Self-pay | Admitting: *Deleted

## 2023-10-15 ENCOUNTER — Ambulatory Visit: Payer: Self-pay | Admitting: *Deleted

## 2023-10-15 ENCOUNTER — Encounter: Payer: Self-pay | Admitting: *Deleted

## 2023-10-15 ENCOUNTER — Other Ambulatory Visit: Payer: Self-pay | Admitting: Hematology and Oncology

## 2023-10-15 MED ORDER — LORAZEPAM 0.5 MG PO TABS: 0.5000 mg | ORAL_TABLET | Freq: Every evening | ORAL | 0 refills | Status: DC | PRN

## 2023-10-15 NOTE — Telephone Encounter (Signed)
 Called pt to see how she did with her recent treatment.  She reports sleeping all day & slept last night.  She denies any problems this am. She knows her appts & how to reach us  if needed.  She asked if she could get the same med she had yesterday for home use b/c it worked well for her.  Message to Dr Ector Goltz RN

## 2023-10-15 NOTE — Telephone Encounter (Signed)
 Received call from pt requesting prescription for p.o Ativan to take at home for anxiety related to breast cancer diagnosis and tx.  Pt states she had a dose during her tx yesterday and felt that it really helped keep her calm to proceed with tx.  RN will review with MD for orders.

## 2023-10-15 NOTE — Telephone Encounter (Signed)
-----   Message from Conway Dennis sent at 10/14/2023  3:39 PM EDT ----- Donna [name] looks good.  Please let patient know. ----- Message ----- From: Interface, Rad Results In Sent: 10/14/2023   2:45 PM EDT To: Percival Brace, NP

## 2023-10-15 NOTE — Telephone Encounter (Signed)
-----   Message from Nurse Rexene Catching sent at 10/14/2023  1:52 PM EDT ----- Regarding: FT immunotherapy- gudena/Lindsey Please address as "Donna [name]." First time herceptin only treatment. Completed without incident.

## 2023-10-15 NOTE — Telephone Encounter (Signed)
 Per Alwin Baars, NP, called pt and left vm to make aware of message below. Advised pt to call if there was any further questions

## 2023-10-16 ENCOUNTER — Telehealth: Payer: Self-pay | Admitting: *Deleted

## 2023-10-16 MED ORDER — TAMOXIFEN CITRATE 20 MG PO TABS: 20.0000 mg | ORAL_TABLET | Freq: Every day | ORAL | 3 refills | Status: DC

## 2023-10-16 NOTE — Telephone Encounter (Signed)
 Tamoxifen  sent to update pharmacy, [facility] in [place] per pt request.

## 2023-10-18 ENCOUNTER — Telehealth: Payer: Self-pay | Admitting: *Deleted

## 2023-10-18 NOTE — Telephone Encounter (Signed)
 Received call from pt requesting advice as to where she needs to go to be fitted for post surgical compression band.  RN attempt x1 to return call.  No answer.  LVM explaining pt will need [facility] any day before her surgery and to bring the prescription that was given to her during breast clinic.

## 2023-10-30 NOTE — Progress Notes (Signed)
 Surgical Instructions   Your procedure is scheduled on Tuesday November 05, 2023. Report to [facility] Main Entrance "A" at 10:00 A.M., then check in with the Admitting office. Any questions or running late day of surgery: call [phone]  Questions prior to your surgery date: call [phone], Monday-Friday, 8am-4pm. If you experience any cold or flu symptoms such as cough, fever, chills, shortness of breath, etc. between now and your scheduled surgery, please notify us  at the above number.     Remember:  Do not eat after midnight the night before your surgery   You may drink clear liquids until 9:00 the morning of your surgery.   Clear liquids allowed are: Water, Non-Citrus Juices (without pulp), Carbonated Beverages, Clear Tea (no milk, honey, etc.), Black Coffee Only (NO MILK, CREAM OR POWDERED CREAMER of any kind), and Gatorade.  Patient Instructions  The night before surgery:  No food after midnight. ONLY clear liquids after midnight  The day of surgery (if you do NOT have diabetes):  Drink ONE (1) Pre-Surgery Clear Ensure by 9:00 the morning of surgery. Drink in one sitting. Do not sip.  This drink was given to you during your hospital  pre-op appointment visit.  Nothing else to drink after completing the  Pre-Surgery Clear Ensure.        If you have questions, please contact your surgeon's office.   Take these medicines the morning of surgery with A SIP OF WATER  tamoxifen  (NOLVADEX )   May take these medicines IF NEEDED: LORazepam  (ATIVAN )   One week prior to surgery, STOP taking any Aspirin (unless otherwise instructed by your surgeon) Aleve, Naproxen, Ibuprofen , Motrin , Advil , Goody's, BC's, all herbal medications, fish oil, and non-prescription vitamins.                     Do NOT Smoke (Tobacco/Vaping) for 24 hours prior to your procedure.  If you use a CPAP at night, you may bring your mask/headgear for your overnight stay.   You will be asked to remove any  contacts, glasses, piercing's, hearing aid's, dentures/partials prior to surgery. Please bring cases for these items if needed.    Patients discharged the day of surgery will not be allowed to drive home, and someone needs to stay with them for 24 hours.  SURGICAL WAITING ROOM VISITATION Patients may have no more than 2 support people in the waiting area - these visitors may rotate.   Pre-op nurse will coordinate an appropriate time for 1 ADULT support person, who may not rotate, to accompany patient in pre-op.  Children under the age of 73 must have an adult with them who is not the patient and must remain in the main waiting area with an adult.  If the patient needs to stay at the hospital during part of their recovery, the visitor guidelines for inpatient rooms apply.  Please refer to the [facility] website for the visitor guidelines for any additional information.   If you received a COVID test during your pre-op visit  it is requested that you wear a mask when out in public, stay away from anyone that may not be feeling well and notify your surgeon if you develop symptoms. If you have been in contact with anyone that has tested positive in the last 10 days please notify you surgeon.      Pre-operative CHG Bathing Instructions   You can play a key role in reducing the risk of infection after surgery. Your skin needs  to be as free of germs as possible. You can reduce the number of germs on your skin by washing with CHG (chlorhexidine  gluconate) soap before surgery. CHG is an antiseptic soap that kills germs and continues to kill germs even after washing.   DO NOT use if you have an allergy to chlorhexidine /CHG or antibacterial soaps. If your skin becomes reddened or irritated, stop using the CHG and notify one of our RNs at [phone].              TAKE A SHOWER THE NIGHT BEFORE SURGERY AND THE DAY OF SURGERY    Please keep in mind the following:  DO NOT shave, including legs and  underarms, 48 hours prior to surgery.   You may shave your face before/day of surgery.  Place clean sheets on your bed the night before surgery Use a clean washcloth (not used since being washed) for each shower. DO NOT sleep with pet's night before surgery.  CHG Shower Instructions:  Wash your face and private area with normal soap. If you choose to wash your hair, wash first with your normal shampoo.  After you use shampoo/soap, rinse your hair and body thoroughly to remove shampoo/soap residue.  Turn the water OFF and apply half the bottle of CHG soap to a CLEAN washcloth.  Apply CHG soap ONLY FROM YOUR NECK DOWN TO YOUR TOES (washing for 3-5 minutes)  DO NOT use CHG soap on face, private areas, open wounds, or sores.  Pay special attention to the area where your surgery is being performed.  If you are having back surgery, having someone wash your back for you may be helpful. Wait 2 minutes after CHG soap is applied, then you may rinse off the CHG soap.  Pat dry with a clean towel  Put on clean pajamas    Additional instructions for the day of surgery: DO NOT APPLY any lotions, deodorants, cologne, or perfumes.   Do not wear jewelry or makeup Do not wear nail polish, gel polish, artificial nails, or any other type of covering on natural nails (fingers and toes) Do not bring valuables to the hospital. [facility] is not responsible for valuables/personal belongings. Put on clean/comfortable clothes.  Please brush your teeth.  Ask your nurse before applying any prescription medications to the skin.

## 2023-10-31 ENCOUNTER — Encounter (HOSPITAL_COMMUNITY): Payer: Self-pay

## 2023-10-31 ENCOUNTER — Encounter (HOSPITAL_COMMUNITY)
Admission: RE | Admit: 2023-10-31 | Discharge: 2023-10-31 | Disposition: A | Discharge status: Home / Self Care | Source: Ambulatory Visit | Attending: Surgery | Admitting: Surgery

## 2023-10-31 ENCOUNTER — Other Ambulatory Visit: Payer: Self-pay

## 2023-10-31 VITALS — BP 108/73 | HR 61 | Temp 98.4°F | Resp 20 | Ht 76.0 in | Wt 214.1 lb

## 2023-10-31 DIAGNOSIS — Z01812 Encounter for preprocedural laboratory examination: Secondary | ICD-10-CM | POA: Insufficient documentation

## 2023-10-31 DIAGNOSIS — Z01818 Encounter for other preprocedural examination: Secondary | ICD-10-CM

## 2023-10-31 LAB — POCT PREGNANCY, URINE: Preg Test, Ur: NEGATIVE

## 2023-10-31 MED ORDER — CHLORHEXIDINE GLUCONATE 0.12 % MT SOLN: 15.0000 mL | Freq: Once | OROMUCOSAL | Status: DC

## 2023-10-31 MED ORDER — LACTATED RINGERS IV SOLN: INTRAVENOUS | Status: DC

## 2023-10-31 MED ORDER — ORAL CARE MOUTH RINSE: 15.0000 mL | Freq: Once | OROMUCOSAL | Status: DC

## 2023-10-31 NOTE — Progress Notes (Signed)
 PCP - No PCP Cardiologist -   PPM/ICD - denies Device Orders -  Rep Notified -   Chest x-ray - denies EKG - denies Stress Test - denies ECHO - denies Cardiac Cath - denies  Sleep Study - denies  DM denies   Blood Thinner Instructions: denies Aspirin Instructions:n/a  ERAS Protcol - clear liquids until 9:00 am PRE-SURGERY Ensure    COVID TEST- n/a   Anesthesia review: yes breast seed implant. POCT urine negative  Patient denies shortness of breath, fever, cough and chest pain at PAT appointment   All instructions explained to the patient, with a verbal understanding of the material. Patient agrees to go over the instructions while at home for a better understanding. Patient also instructed to self quarantine after being tested for COVID-19. The opportunity to ask questions was provided.

## 2023-11-01 ENCOUNTER — Other Ambulatory Visit: Payer: Self-pay | Admitting: Surgery

## 2023-11-01 ENCOUNTER — Ambulatory Visit
Admission: RE | Admit: 2023-11-01 | Discharge: 2023-11-01 | Disposition: A | Discharge status: Home / Self Care | Source: Ambulatory Visit | Attending: Surgery | Admitting: Surgery

## 2023-11-01 ENCOUNTER — Inpatient Hospital Stay: Admission: RE | Admit: 2023-11-01 | Source: Ambulatory Visit

## 2023-11-01 DIAGNOSIS — C50912 Malignant neoplasm of unspecified site of left female breast: Secondary | ICD-10-CM

## 2023-11-01 HISTORY — PX: BREAST BIOPSY: SHX20

## 2023-11-04 ENCOUNTER — Inpatient Hospital Stay: Attending: Hematology and Oncology | Admitting: Hematology and Oncology

## 2023-11-04 ENCOUNTER — Inpatient Hospital Stay

## 2023-11-04 VITALS — BP 95/61 | HR 65 | Temp 98.4°F | Ht 76.0 in | Wt 221.3 lb

## 2023-11-04 DIAGNOSIS — Z79899 Other long term (current) drug therapy: Secondary | ICD-10-CM | POA: Insufficient documentation

## 2023-11-04 DIAGNOSIS — Z1721 Progesterone receptor positive status: Secondary | ICD-10-CM | POA: Diagnosis not present

## 2023-11-04 DIAGNOSIS — Z170 Estrogen receptor positive status [ER+]: Secondary | ICD-10-CM | POA: Insufficient documentation

## 2023-11-04 DIAGNOSIS — Z1731 Human epidermal growth factor receptor 2 positive status: Secondary | ICD-10-CM | POA: Insufficient documentation

## 2023-11-04 DIAGNOSIS — Z79810 Long term (current) use of selective estrogen receptor modulators (SERMs): Secondary | ICD-10-CM | POA: Diagnosis not present

## 2023-11-04 DIAGNOSIS — C50412 Malignant neoplasm of upper-outer quadrant of left female breast: Secondary | ICD-10-CM

## 2023-11-04 DIAGNOSIS — Z5112 Encounter for antineoplastic immunotherapy: Secondary | ICD-10-CM | POA: Insufficient documentation

## 2023-11-04 MED ORDER — DIPHENHYDRAMINE HCL 25 MG PO CAPS
25.0000 mg | ORAL_CAPSULE | Freq: Once | ORAL | Status: AC
  Administered 2023-11-04: 25 mg via ORAL
  Filled 2023-11-04: qty 1

## 2023-11-04 MED ORDER — ACETAMINOPHEN 325 MG PO TABS
650.0000 mg | ORAL_TABLET | Freq: Once | ORAL | Status: AC
  Administered 2023-11-04: 650 mg via ORAL
  Filled 2023-11-04: qty 2

## 2023-11-04 MED ORDER — SODIUM CHLORIDE 0.9% FLUSH: 10.0000 mL | INTRAVENOUS | Status: DC | PRN

## 2023-11-04 MED ORDER — SODIUM CHLORIDE 0.9 % IV SOLN: INTRAVENOUS | Status: DC

## 2023-11-04 MED ORDER — TRASTUZUMAB-ANNS CHEMO 150 MG IV SOLR
6.0000 mg/kg | Freq: Once | INTRAVENOUS | Status: AC
  Administered 2023-11-04: 600 mg via INTRAVENOUS
  Filled 2023-11-04: qty 28.57

## 2023-11-04 NOTE — Patient Instructions (Signed)
 [facility]  Discharge Instructions: Thank you for choosing [facility] to provide your oncology and hematology care.   If you have a lab appointment with the Cancer Center, please go directly to the Cancer Center and check in at the registration area.   Wear comfortable clothing and clothing appropriate for easy access to any Portacath or PICC line.   We strive to give you quality time with your provider. You may need to reschedule your appointment if you arrive late (15 or more minutes).  Arriving late affects you and other patients whose appointments are after yours.  Also, if you miss three or more appointments without notifying the office, you may be dismissed from the clinic at the provider's discretion.      For prescription refill requests, have your pharmacy contact our office and allow 72 hours for refills to be completed.    Today you received the following chemotherapy and/or immunotherapy agents: Trastuzumab      To help prevent nausea and vomiting after your treatment, we encourage you to take your nausea medication as directed.  BELOW ARE SYMPTOMS THAT SHOULD BE REPORTED IMMEDIATELY: *FEVER GREATER THAN 100.4 F (38 C) OR HIGHER *CHILLS OR SWEATING *NAUSEA AND VOMITING THAT IS NOT CONTROLLED WITH YOUR NAUSEA MEDICATION *UNUSUAL SHORTNESS OF BREATH *UNUSUAL BRUISING OR BLEEDING *URINARY PROBLEMS (pain or burning when urinating, or frequent urination) *BOWEL PROBLEMS (unusual diarrhea, constipation, pain near the anus) TENDERNESS IN MOUTH AND THROAT WITH OR WITHOUT PRESENCE OF ULCERS (sore throat, sores in mouth, or a toothache) UNUSUAL RASH, SWELLING OR PAIN  UNUSUAL VAGINAL DISCHARGE OR ITCHING   Items with * indicate a potential emergency and should be followed up as soon as possible or go to the Emergency Department if any problems should occur.  Please show the CHEMOTHERAPY ALERT CARD or  IMMUNOTHERAPY ALERT CARD at check-in to the Emergency Department and triage nurse.  Should you have questions after your visit or need to cancel or reschedule your appointment, please contact [facility]  Dept: [phone]  and follow the prompts.  Office hours are 8:00 a.m. to 4:30 p.m. Monday - Friday. Please note that voicemails left after 4:00 p.m. may not be returned until the following business day.  We are closed weekends and major holidays. You have access to a nurse at all times for urgent questions. Please call the main number to the clinic Dept: [phone] and follow the prompts.   For any non-urgent questions, you may also contact your provider using MyChart. We now offer e-Visits for anyone 80 and older to request care online for non-urgent symptoms. For details visit [email or web address].   Also download the MyChart app! Go to the app store, search "MyChart", open the app, select [place], and log in with your MyChart username and password.  Trastuzumab Injection What is this medication? TRASTUZUMAB (tras TOO zoo mab) treats breast cancer and stomach cancer. It works by blocking a protein that causes cancer cells to grow and multiply. This helps to slow or stop the spread of cancer cells. This medicine may be used for other purposes; ask your health care provider or pharmacist if you have questions. COMMON BRAND NAME(S): Herceptin, HERCESSI, Herzuma, KANJINTI, Ogivri, Ontruzant, Trazimera What should I tell my care team before I take this medication? They need to know if you have any of  these conditions: Heart failure Lung disease An unusual or allergic reaction to trastuzumab, other medications, foods, dyes, or preservatives Pregnant or trying to get pregnant Breast-feeding How should I use this medication? This medication is injected into a vein. It is given by your care team in a hospital or clinic setting. Talk  to your care team about the use of this medication in children. It is not approved for use in children. Overdosage: If you think you have taken too much of this medicine contact a poison control center or emergency room at once. NOTE: This medicine is only for you. Do not share this medicine with others. What if I miss a dose? Keep appointments for follow-up doses. It is important not to miss your dose. Call your care team if you are unable to keep an appointment. What may interact with this medication? Certain types of chemotherapy, such as daunorubicin, doxorubicin, epirubicin, idarubicin This list may not describe all possible interactions. Give your health care provider a list of all the medicines, herbs, non-prescription drugs, or dietary supplements you use. Also tell them if you smoke, drink alcohol, or use illegal drugs. Some items may interact with your medicine. What should I watch for while using this medication? Your condition will be monitored carefully while you are receiving this medication. This medication may make you feel generally unwell. This is not uncommon, as chemotherapy affects healthy cells as well as cancer cells. Report any side effects. Continue your course of treatment even though you feel ill unless your care team tells you to stop. This medication may increase your risk of getting an infection. Call your care team for advice if you get a fever, chills, sore throat, or other symptoms of a cold or flu. Do not treat yourself. Try to avoid being around people who are sick. Avoid taking medications that contain aspirin, acetaminophen, ibuprofen, naproxen, or ketoprofen unless instructed by your care team. These medications can hide a fever. Talk to your care team if you may be pregnant. Serious birth defects can occur if you take this medication during pregnancy and for 7 months after the last dose. You will need a negative pregnancy test before starting this medication.  Contraception is recommended while taking this medication and for 7 months after the last dose. Your care team can help you find the option that works for you. Do not breastfeed while taking this medication and for 7 months after stopping treatment. What side effects may I notice from receiving this medication? Side effects that you should report to your care team as soon as possible: Allergic reactions or angioedema--skin rash, itching or hives, swelling of the face, eyes, lips, tongue, arms, or legs, trouble swallowing or breathing Dry cough, shortness of breath or trouble breathing Heart failure--shortness of breath, swelling of the ankles, feet, or hands, sudden weight gain, unusual weakness or fatigue Infection--fever, chills, cough, or sore throat Infusion reactions--chest pain, shortness of breath or trouble breathing, feeling faint or lightheaded Side effects that usually do not require medical attention (report to your care team if they continue or are bothersome): Diarrhea Dizziness Headache Nausea Trouble sleeping Vomiting This list may not describe all possible side effects. Call your doctor for medical advice about side effects. You may report side effects to FDA at 1-800-FDA-1088. Where should I keep my medication? This medication is given in a hospital or clinic. It will not be stored at home. NOTE: This sheet is a summary. It may not cover all possible information.  If you have questions about this medicine, talk to your doctor, pharmacist, or health care provider.  2024 Elsevier/Gold Standard (2021-10-03 00:00:00)

## 2023-11-04 NOTE — Assessment & Plan Note (Signed)
 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 Neoadjuvant chemotherapy with TCH Perjeta 6 cycles followed by Herceptin  Perjeta maintenance versus Kadcyla maintenance (based on response to neoadjuvant chemo) for 1 year (patient decided to not proceed with chemo because of fear of losing employment), patient is starting tamoxifen  with Herceptin  injections on 10/11/2023 2. Followed by breast conserving surgery if possible with sentinel lymph node study 3. Followed by adjuvant radiation therapy if patient had lumpectomy Genetic testing ----------------------------------------------------------------------- New treatment plan: Herceptin  injections every [redacted] weeks along with tamoxifen  as neoadjuvant treatment.  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   Tamoxifen  toxicities:  Return to clinic every 3 weeks for Herceptin .

## 2023-11-04 NOTE — Progress Notes (Signed)
 Patient Care Team: Patient, No Pcp Per as PCP - General (General Practice) Auther Bo, RN as Oncology Nurse Navigator Alane Hsu, RN as Oncology Nurse Navigator Sim Dryer, MD as Consulting Physician (General Surgery) Cameron Cea, MD as Consulting Physician (Hematology and Oncology) Johna Myers, MD as Consulting Physician (Radiation Oncology)  DIAGNOSIS:  Encounter Diagnosis  Name Primary?   Malignant neoplasm of upper-outer quadrant of left breast in female, estrogen receptor positive (HCC) Yes    SUMMARY OF ONCOLOGIC HISTORY: Oncology History  Malignant neoplasm of upper-outer quadrant of left breast in female, estrogen receptor positive (HCC)  08/23/2023 Initial Diagnosis   Palpable left breast mass at 2 o'clock position measuring 3.1 cm, 1 axillary lymph node biopsy: Concordant, D density breast, biopsy: Grade 2 IDC ER 90%, PR 95%, Ki67 30%, HER2 3+ positive   09/04/2023 Cancer Staging   Staging form: Breast, AJCC 8th Edition - Clinical: Stage IB (cT2, cN0, cM0, G2, ER+, PR+, HER2+) - Signed by Cameron Cea, MD on 09/04/2023 Stage prefix: Initial diagnosis Histologic grading system: 3 grade system   09/12/2023 Genetic Testing   Negative [facility] CancerNext-Expanded +RNAinsight Panel.  Report date is 09/13/2023.   The CancerNext-Expanded gene panel offered by [facility] and includes sequencing, rearrangement, and RNA analysis for the following 76 genes: AIP, ALK, APC, ATM, AXIN2, BAP1, BARD1, BMPR1A, BRCA1, BRCA2, BRIP1, CDC73, CDH1, CDK4, CDKN1B, CDKN2A, CEBPA, CHEK2, CTNNA1, DDX41, DICER1, ETV6, FH, FLCN, GATA2, LZTR1, MAX, MBD4, MEN1, MET, MLH1, MSH2, MSH3, MSH6, MUTYH, NF1, NF2, NTHL1, PALB2, PHOX2B, PMS2, POT1, PRKAR1A, PTCH1, PTEN, RAD51C, RAD51D, RB1, RET, RUNX1, SDHA, SDHAF2, SDHB, SDHC, SDHD, SMAD4, SMARCA4, SMARCB1, SMARCE1, STK11, SUFU, TMEM127, TP53, TSC1, TSC2, VHL, and WT1 (sequencing and deletion/duplication); EGFR, HOXB13, KIT, MITF, PDGFRA, POLD1, and  POLE (sequencing only); EPCAM and GREM1 (deletion/duplication only).    10/14/2023 -  Chemotherapy   Patient is on Treatment Plan : BREAST MAINTENANCE Trastuzumab  IV (6) or SQ (600) D1 q21d X 11 Cycles       CHIEF COMPLIANT: Herceptin  and tamoxifen   HISTORY OF PRESENT ILLNESS:  History of Present Illness Donna [name] "Donna [name]" is a 33 year old female undergoing treatment for breast cancer who presents for follow-up and treatment scheduling.  She is in her second treatment cycle and experiences no fatigue, weakness, or severe hot flashes from tamoxifen . She feels slightly tired, which she attributes to her natural sleep pattern. An upcoming surgery is scheduled for tomorrow.     ALLERGIES:  has no known allergies.  MEDICATIONS:  Current Outpatient Medications  Medication Sig Dispense Refill   APPLE CIDER VINEGAR ULTRA PO Take 1 capsule by mouth once a week.     LORazepam  (ATIVAN ) 0.5 MG tablet Take 1 tablet (0.5 mg total) by mouth at bedtime as needed for anxiety. 30 tablet 0   Melatonin (SLEEP GUMMIES PO) Take 3 each by mouth at bedtime. OLLY Sleep Gummy, 3 mg Melatonin, L-Theanine, Chamomile, Lemon Balm, Sleep Aid, Blackberry     tamoxifen  (NOLVADEX ) 20 MG tablet Take 1 tablet (20 mg total) by mouth daily. 90 tablet 3   No current facility-administered medications for this visit.   Facility-Administered Medications Ordered in Other Visits  Medication Dose Route Frequency Provider Last Rate Last Admin   0.9 %  sodium chloride  infusion   Intravenous Continuous Gaylin Bulthuis, MD   Stopped at 11/04/23 1107   sodium chloride  flush (NS) 0.9 % injection 10 mL  10 mL Intracatheter PRN Cameron Cea, MD  PHYSICAL EXAMINATION: ECOG PERFORMANCE STATUS: 1 - Symptomatic but completely ambulatory  Vitals:   11/04/23 0850  BP: 95/61  Pulse: 65  Temp: 98.4 F (36.9 C)  SpO2: 100%   Filed Weights   11/04/23 0850  Weight: 221 lb 4.8 oz (100.4 kg)      LABORATORY DATA:  I  have reviewed the data as listed    Latest Ref Rng & Units 10/14/2023    8:34 AM 09/04/2023   12:33 PM 10/10/2020    3:45 AM  CMP  Glucose 70 - 99 mg/dL 98  87  161   BUN 6 - 20 mg/dL 8  8  6    Creatinine 0.44 - 1.00 mg/dL 0.96  0.45  4.09   Sodium 135 - 145 mmol/L 141  139  136   Potassium 3.5 - 5.1 mmol/L 3.3  4.4  3.6   Chloride 98 - 111 mmol/L 111  106  102   CO2 22 - 32 mmol/L 25  30  27    Calcium 8.9 - 10.3 mg/dL 8.3  9.2  8.8   Total Protein 6.5 - 8.1 g/dL 6.3  7.2    Total Bilirubin 0.0 - 1.2 mg/dL 0.3  0.5    Alkaline Phos 38 - 126 U/L 45  47    AST 15 - 41 U/L 25  16    ALT 0 - 44 U/L 33  13      Lab Results  Component Value Date   WBC 7.4 10/14/2023   HGB 13.0 10/14/2023   HCT 38.1 10/14/2023   MCV 84.5 10/14/2023   PLT 176 10/14/2023   NEUTROABS 3.8 10/14/2023    ASSESSMENT & PLAN:  Malignant neoplasm of upper-outer quadrant of left breast in female, estrogen receptor positive (HCC) 08/23/2023:Palpable left breast mass at 2 o'clock position measuring 3.1 cm, 1 axillary lymph node biopsy: Concordant, D density breast, biopsy: Grade 2 IDC ER 90%, PR 95%, Ki67 30%, HER2 3+ positive    Recommendation  1. Neoadjuvant chemotherapy with TCH Perjeta 6 cycles followed by Herceptin  Perjeta maintenance versus Kadcyla maintenance (based on response to neoadjuvant chemo) for 1 year (patient decided to not proceed with chemo because of fear of losing employment), patient is starting tamoxifen  with Herceptin  injections on 10/11/2023 2. Followed by breast conserving surgery if possible with sentinel lymph node study 3. Followed by adjuvant radiation therapy if patient had lumpectomy Genetic testing ----------------------------------------------------------------------- New treatment plan: Herceptin  injections every [redacted] weeks along with tamoxifen  as neoadjuvant treatment.  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   Tamoxifen  toxicities: Tolerating tamoxifen  extremely well without any problems. I instructed her to stop taking the Tamoxifen  for a week after surgery  Patient is scheduled for surgery tomorrow. I would like to see her after surgery for a follow-up along with her Herceptin  infusion to discuss her treatment plan. Return to clinic every 3 weeks for Herceptin . Assessment & Plan Malignant neoplasm of upper-outer quadrant of left breast, estrogen receptor positive Undergoing Herceptin  and Tamoxifen  treatment. No significant side effects reported. Surgery scheduled tomorrow. Advised to stop Tamoxifen  for a week post-surgery to reduce clot risk. - Administer Herceptin  injection on June 27 and July 25. - Stop Tamoxifen  for one week starting today. - Resume Tamoxifen  one week post-surgery.   No orders of the defined types were placed in this encounter.  The patient has a good understanding of the overall plan.  she agrees with it. she will call with any problems that may develop before the next visit here. Total time spent: 30 mins including face to face time and time spent for planning, charting and co-ordination of care   Margert Sheerer, MD 11/04/23

## 2023-11-05 ENCOUNTER — Ambulatory Visit (HOSPITAL_COMMUNITY)

## 2023-11-05 ENCOUNTER — Ambulatory Visit (HOSPITAL_COMMUNITY): Payer: Self-pay | Admitting: Physician Assistant

## 2023-11-05 ENCOUNTER — Ambulatory Visit
Admission: RE | Admit: 2023-11-05 | Discharge: 2023-11-05 | Disposition: A | Discharge status: Home / Self Care | Source: Ambulatory Visit | Attending: Surgery | Admitting: Surgery

## 2023-11-05 ENCOUNTER — Other Ambulatory Visit: Payer: Self-pay

## 2023-11-05 ENCOUNTER — Encounter (HOSPITAL_COMMUNITY)
Admission: RE | Disposition: A | Discharge status: Home / Self Care | Payer: Self-pay | Source: Home / Self Care | Attending: Surgery

## 2023-11-05 ENCOUNTER — Encounter (HOSPITAL_COMMUNITY): Payer: Self-pay | Admitting: Surgery

## 2023-11-05 ENCOUNTER — Ambulatory Visit (HOSPITAL_COMMUNITY)
Admission: RE | Admit: 2023-11-05 | Discharge: 2023-11-05 | Disposition: A | Discharge status: Home / Self Care | Attending: Surgery | Admitting: Surgery

## 2023-11-05 DIAGNOSIS — C50412 Malignant neoplasm of upper-outer quadrant of left female breast: Secondary | ICD-10-CM | POA: Diagnosis present

## 2023-11-05 DIAGNOSIS — Z170 Estrogen receptor positive status [ER+]: Secondary | ICD-10-CM | POA: Insufficient documentation

## 2023-11-05 DIAGNOSIS — Z1731 Human epidermal growth factor receptor 2 positive status: Secondary | ICD-10-CM | POA: Insufficient documentation

## 2023-11-05 DIAGNOSIS — N641 Fat necrosis of breast: Secondary | ICD-10-CM | POA: Diagnosis not present

## 2023-11-05 DIAGNOSIS — C50912 Malignant neoplasm of unspecified site of left female breast: Secondary | ICD-10-CM

## 2023-11-05 DIAGNOSIS — Z1721 Progesterone receptor positive status: Secondary | ICD-10-CM | POA: Insufficient documentation

## 2023-11-05 DIAGNOSIS — F17210 Nicotine dependence, cigarettes, uncomplicated: Secondary | ICD-10-CM | POA: Insufficient documentation

## 2023-11-05 HISTORY — PX: BREAST LUMPECTOMY WITH RADIOACTIVE SEED AND SENTINEL LYMPH NODE BIOPSY: SHX6550

## 2023-11-05 HISTORY — PX: SENTINEL NODE BIOPSY: SHX6608

## 2023-11-05 SURGERY — BREAST LUMPECTOMY WITH RADIOACTIVE SEED AND SENTINEL LYMPH NODE BIOPSY
Anesthesia: General | Site: Breast | Laterality: Left

## 2023-11-05 MED ORDER — MIDAZOLAM HCL 2 MG/2ML IJ SOLN
INTRAMUSCULAR | Status: AC
  Administered 2023-11-05: 2 mg via INTRAVENOUS
  Filled 2023-11-05: qty 2

## 2023-11-05 MED ORDER — MIDAZOLAM HCL 2 MG/2ML IJ SOLN: 2.0000 mg | Freq: Once | INTRAMUSCULAR | Status: AC

## 2023-11-05 MED ORDER — LACTATED RINGERS IV SOLN: INTRAVENOUS | Status: DC

## 2023-11-05 MED ORDER — KETOROLAC TROMETHAMINE 30 MG/ML IJ SOLN
INTRAMUSCULAR | Status: DC | PRN
  Administered 2023-11-05: 30 mg via INTRAVENOUS

## 2023-11-05 MED ORDER — LIDOCAINE 2% (20 MG/ML) 5 ML SYRINGE
INTRAMUSCULAR | Status: DC | PRN
  Administered 2023-11-05: 60 mg via INTRAVENOUS

## 2023-11-05 MED ORDER — BUPIVACAINE-EPINEPHRINE (PF) 0.5% -1:200000 IJ SOLN
INTRAMUSCULAR | Status: DC | PRN
Start: 2023-11-05 — End: 2023-11-05
  Administered 2023-11-05: 30 mL via PERINEURAL

## 2023-11-05 MED ORDER — AMISULPRIDE (ANTIEMETIC) 5 MG/2ML IV SOLN: 10.0000 mg | Freq: Once | INTRAVENOUS | Status: DC | PRN

## 2023-11-05 MED ORDER — FENTANYL CITRATE (PF) 100 MCG/2ML IJ SOLN
INTRAMUSCULAR | Status: AC
Start: 2023-11-05 — End: ?
  Filled 2023-11-05: qty 2

## 2023-11-05 MED ORDER — KETOROLAC TROMETHAMINE 30 MG/ML IJ SOLN
INTRAMUSCULAR | Status: AC
  Filled 2023-11-05: qty 1

## 2023-11-05 MED ORDER — DEXAMETHASONE SODIUM PHOSPHATE 10 MG/ML IJ SOLN
INTRAMUSCULAR | Status: DC | PRN
  Administered 2023-11-05: 10 mg via INTRAVENOUS

## 2023-11-05 MED ORDER — PROPOFOL 10 MG/ML IV BOLUS
INTRAVENOUS | Status: DC | PRN
  Administered 2023-11-05: 200 mg via INTRAVENOUS
  Administered 2023-11-05: 50 mg via INTRAVENOUS

## 2023-11-05 MED ORDER — METHYLENE BLUE (ANTIDOTE) 1 % IV SOLN
INTRAVENOUS | Status: AC
  Filled 2023-11-05: qty 10

## 2023-11-05 MED ORDER — MIDAZOLAM HCL 2 MG/2ML IJ SOLN
INTRAMUSCULAR | Status: AC
Start: 2023-11-05 — End: ?
  Filled 2023-11-05: qty 2

## 2023-11-05 MED ORDER — ONDANSETRON HCL 4 MG/2ML IJ SOLN
INTRAMUSCULAR | Status: DC | PRN
  Administered 2023-11-05: 4 mg via INTRAVENOUS

## 2023-11-05 MED ORDER — BUPIVACAINE-EPINEPHRINE 0.25% -1:200000 IJ SOLN
INTRAMUSCULAR | Status: DC | PRN
  Administered 2023-11-05: 17 mL

## 2023-11-05 MED ORDER — 0.9 % SODIUM CHLORIDE (POUR BTL) OPTIME
TOPICAL | Status: DC | PRN
  Administered 2023-11-05: 1000 mL

## 2023-11-05 MED ORDER — CHLORHEXIDINE GLUCONATE CLOTH 2 % EX PADS: 6.0000 | MEDICATED_PAD | Freq: Once | CUTANEOUS | Status: DC

## 2023-11-05 MED ORDER — MAGTRACE LYMPHATIC TRACER
INTRAMUSCULAR | Status: DC | PRN
  Administered 2023-11-05: 2 mL via INTRAMUSCULAR

## 2023-11-05 MED ORDER — CLONIDINE HCL (ANALGESIA) 100 MCG/ML EP SOLN
EPIDURAL | Status: DC | PRN
  Administered 2023-11-05: 50 ug

## 2023-11-05 MED ORDER — FENTANYL CITRATE (PF) 250 MCG/5ML IJ SOLN
INTRAMUSCULAR | Status: DC | PRN
  Administered 2023-11-05 (×4): 25 ug via INTRAVENOUS

## 2023-11-05 MED ORDER — FENTANYL CITRATE (PF) 100 MCG/2ML IJ SOLN
25.0000 ug | INTRAMUSCULAR | Status: DC | PRN
  Administered 2023-11-05: 50 ug via INTRAVENOUS

## 2023-11-05 MED ORDER — DEXAMETHASONE SODIUM PHOSPHATE 10 MG/ML IJ SOLN
INTRAMUSCULAR | Status: AC
  Filled 2023-11-05: qty 1

## 2023-11-05 MED ORDER — ACETAMINOPHEN 500 MG PO TABS
1000.0000 mg | ORAL_TABLET | Freq: Once | ORAL | Status: AC
  Administered 2023-11-05: 1000 mg via ORAL
  Filled 2023-11-05: qty 2

## 2023-11-05 MED ORDER — EPHEDRINE SULFATE-NACL 50-0.9 MG/10ML-% IV SOSY
PREFILLED_SYRINGE | INTRAVENOUS | Status: DC | PRN
  Administered 2023-11-05: 10 mg via INTRAVENOUS

## 2023-11-05 MED ORDER — OXYCODONE HCL 5 MG PO TABS: 5.0000 mg | ORAL_TABLET | Freq: Four times a day (QID) | ORAL | 0 refills | Status: DC | PRN

## 2023-11-05 MED ORDER — ONDANSETRON HCL 4 MG/2ML IJ SOLN
INTRAMUSCULAR | Status: AC
  Filled 2023-11-05: qty 2

## 2023-11-05 MED ORDER — BUPIVACAINE-EPINEPHRINE (PF) 0.25% -1:200000 IJ SOLN
INTRAMUSCULAR | Status: AC
  Filled 2023-11-05: qty 30

## 2023-11-05 MED ORDER — ONDANSETRON HCL 4 MG/2ML IJ SOLN: 4.0000 mg | Freq: Once | INTRAMUSCULAR | Status: DC | PRN

## 2023-11-05 MED ORDER — PROPOFOL 10 MG/ML IV BOLUS
INTRAVENOUS | Status: AC
  Filled 2023-11-05: qty 20

## 2023-11-05 MED ORDER — CEFAZOLIN SODIUM-DEXTROSE 2-4 GM/100ML-% IV SOLN
2.0000 g | INTRAVENOUS | Status: AC
  Administered 2023-11-05: 2 g via INTRAVENOUS
  Filled 2023-11-05: qty 100

## 2023-11-05 MED ORDER — CHLORHEXIDINE GLUCONATE 0.12 % MT SOLN
15.0000 mL | OROMUCOSAL | Status: AC
  Administered 2023-11-05: 15 mL via OROMUCOSAL
  Filled 2023-11-05 (×2): qty 15

## 2023-11-05 MED ORDER — LIDOCAINE 2% (20 MG/ML) 5 ML SYRINGE
INTRAMUSCULAR | Status: AC
Start: 2023-11-05 — End: ?
  Filled 2023-11-05: qty 5

## 2023-11-05 MED ORDER — FENTANYL CITRATE (PF) 100 MCG/2ML IJ SOLN: 50.0000 ug | Freq: Once | INTRAMUSCULAR | Status: AC

## 2023-11-05 MED ORDER — FENTANYL CITRATE (PF) 250 MCG/5ML IJ SOLN
INTRAMUSCULAR | Status: AC
  Filled 2023-11-05: qty 5

## 2023-11-05 MED ORDER — FENTANYL CITRATE (PF) 100 MCG/2ML IJ SOLN
INTRAMUSCULAR | Status: AC
Start: 2023-11-05 — End: 2023-11-05
  Administered 2023-11-05: 50 ug
  Filled 2023-11-05: qty 2

## 2023-11-05 SURGICAL SUPPLY — 36 items
BAG COUNTER SPONGE SURGICOUNT (BAG) IMPLANT
BINDER BREAST LRG (GAUZE/BANDAGES/DRESSINGS) IMPLANT
BINDER BREAST XLRG (GAUZE/BANDAGES/DRESSINGS) IMPLANT
CANISTER SUCTION 3000ML PPV (SUCTIONS) ×2 IMPLANT
CHLORAPREP W/TINT 26 (MISCELLANEOUS) ×2 IMPLANT
CLIP APPLIE 9.375 MED OPEN (MISCELLANEOUS) ×2 IMPLANT
CNTNR URN SCR LID CUP LEK RST (MISCELLANEOUS) IMPLANT
COVER PROBE W GEL 5X96 (DRAPES) ×2 IMPLANT
COVER SURGICAL LIGHT HANDLE (MISCELLANEOUS) ×2 IMPLANT
DERMABOND ADVANCED .7 DNX12 (GAUZE/BANDAGES/DRESSINGS) ×2 IMPLANT
DEVICE DUBIN SPECIMEN MAMMOGRA (MISCELLANEOUS) ×2 IMPLANT
DRAPE CHEST BREAST 15X10 FENES (DRAPES) ×2 IMPLANT
ELECT CAUTERY BLADE 6.4 (BLADE) ×2 IMPLANT
ELECTRODE REM PT RTRN 9FT ADLT (ELECTROSURGICAL) ×2 IMPLANT
GAUZE PAD ABD 8X10 STRL (GAUZE/BANDAGES/DRESSINGS) ×2 IMPLANT
GLOVE BIO SURGEON STRL SZ8 (GLOVE) ×2 IMPLANT
GLOVE BIOGEL PI IND STRL 8 (GLOVE) ×2 IMPLANT
GOWN STRL REUS W/ TWL LRG LVL3 (GOWN DISPOSABLE) ×2 IMPLANT
GOWN STRL REUS W/ TWL XL LVL3 (GOWN DISPOSABLE) ×2 IMPLANT
KIT BASIN OR (CUSTOM PROCEDURE TRAY) ×2 IMPLANT
KIT MARKER MARGIN INK (KITS) ×2 IMPLANT
LIGHT WAVEGUIDE WIDE FLAT (MISCELLANEOUS) IMPLANT
NDL 18GX1X1/2 (RX/OR ONLY) (NEEDLE) IMPLANT
NDL FILTER BLUNT 18X1 1/2 (NEEDLE) IMPLANT
NDL HYPO 25GX1X1/2 BEV (NEEDLE) ×2 IMPLANT
NEEDLE 18GX1X1/2 (RX/OR ONLY) (NEEDLE) IMPLANT
NEEDLE FILTER BLUNT 18X1 1/2 (NEEDLE) ×2 IMPLANT
NEEDLE HYPO 25GX1X1/2 BEV (NEEDLE) ×4 IMPLANT
NS IRRIG 1000ML POUR BTL (IV SOLUTION) ×2 IMPLANT
PACK GENERAL/GYN (CUSTOM PROCEDURE TRAY) ×2 IMPLANT
SUT MNCRL AB 4-0 PS2 18 (SUTURE) ×2 IMPLANT
SUT VIC AB 3-0 SH 18 (SUTURE) ×2 IMPLANT
SYR 3ML LL SCALE MARK (SYRINGE) IMPLANT
SYR CONTROL 10ML LL (SYRINGE) ×2 IMPLANT
TOWEL GREEN STERILE (TOWEL DISPOSABLE) ×2 IMPLANT
TOWEL GREEN STERILE FF (TOWEL DISPOSABLE) ×2 IMPLANT

## 2023-11-05 NOTE — Transfer of Care (Cosign Needed)
 Immediate Anesthesia Transfer of Care Note  Patient: Donna [name]  Procedure(s) Performed: BREAST LUMPECTOMY WITH RADIOACTIVE SEED (Left: Breast) BIOPSY, LYMPH NODE, SENTINEL (Left: Axilla)  Patient Location: PACU  Anesthesia Type:General  Level of Consciousness: drowsy and responds to stimulation  Airway & Oxygen Therapy: Patient Spontanous Breathing and Patient connected to face mask oxygen  Post-op Assessment: Report given to RN and Post -op Vital signs reviewed and stable  Post vital signs: Reviewed and stable  Last Vitals:  Vitals Value Taken Time  BP 133/81 11/05/23 1330  Temp 35.8 C 11/05/23 1330  Pulse 69 11/05/23 1334  Resp 20 11/05/23 1334  SpO2 100 % 11/05/23 1334  Vitals shown include unfiled device data.  Last Pain:  Vitals:   11/05/23 1330  TempSrc:   PainSc: Asleep         Complications: No notable events documented.

## 2023-11-05 NOTE — H&P (Signed)
 History of Present Illness: Donna [name] is a 33 y.o. female who is seen today as an office consultation for evaluation of Breast Cancer  Patient seen in the [facility] due to newly diagnosed left breast mass. Core biopsy showed grade 3 IDC ER positive PR positive HER2/neu positive with a KI of 30%. Imaging of the axilla was negative. She has had the mass for at least 2 to 3 months she thinks. She is a Naval architect and travels frequently.   Review of Systems: A complete review of systems was obtained from the patient. I have reviewed this information and discussed as appropriate with the patient. See HPI as well for other ROS.    Medical History: Past Medical History:  Diagnosis Date  Asthma, unspecified asthma severity, unspecified whether complicated, unspecified whether persistent (HHS-HCC)  History of cancer   Patient Active Problem List  Diagnosis  Malignant neoplasm of upper-outer quadrant of left breast in female, estrogen receptor positive (CMS/HHS-HCC)   Past Surgical History:  Procedure Laterality Date  .Left Breast Biopsy Left 08/23/2023    No Known Allergies  No current outpatient medications on file prior to visit.   No current facility-administered medications on file prior to visit.   Family History  Problem Relation Age of Onset  Obesity Mother    Social History   Tobacco Use  Smoking Status Every Day  Current packs/day: 0.50  Types: Cigarettes  Smokeless Tobacco Not on file    Social History   Socioeconomic History  Marital status: Unknown  Tobacco Use  Smoking status: Every Day  Current packs/day: 0.50  Types: Cigarettes  Vaping Use  Vaping status: Never Used  Substance and Sexual Activity  Alcohol use: Yes  Comment: every day  Drug use: No   Objective:  There were no vitals filed for this visit.  There is no height or weight on file to calculate BMI.  Physical Exam HENT:  Head: Normocephalic.  Cardiovascular:  Rate and Rhythm:  Normal rate.  Pulmonary:  Effort: Pulmonary effort is normal.  Chest:  Breasts: Right: Normal.  Left: Mass present.   Comments: 3 cm mobile mass left breast upper outer quadrant Musculoskeletal:  General: Normal range of motion.  Cervical back: Normal range of motion.  Lymphadenopathy:  Upper Body:  Right upper body: No axillary adenopathy.  Left upper body: No axillary adenopathy.  Skin: General: Skin is warm.  Neurological:  General: No focal deficit present.  Mental Status: She is alert.  Psychiatric:  Mood and Affect: Mood normal.     Labs, Imaging and Diagnostic Testing:  FINAL DIAGNOSIS   1. Breast, left, needle core biopsy, 2:00 6cmfn (ribbon clip) :  INVASIVE DUCTAL CARCINOMA  TUBULE FORMATION: SCORE 3  NUCLEAR PLEOMORPHISM: SCORE 2  MITOTIC COUNT: SCORE 1  TOTAL SCORE: 6  OVERALL GRADE: 2  LYMPHOVASCULAR INVASION: NOT IDENTIFIED  CANCER LENGTH: 1.6 CM  CALCIFICATIONS: NOT IDENTIFIED  OTHER FINDINGS: NONE  SEE NOTE   2. Lymph node, needle/core biopsy, Left axillary (hydromark clip) :  ONE BENIGN LYMPH NODE, NEGATIVE FOR METASTATIC CARCINOMA.   Diagnosis Note :  Dr. Kavin Parsley reviewed the case and concurs with the interpretation. A breast  prognostic profile (ER and PR) is pending and will be reported in an addendum.  [facility] was notified on 08/26/2023.   ELECTRONIC SIGNATURE : Dillard Frame Md, Pathologist, Electronic Signature   MICROSCOPIC DESCRIPTION   CASE COMMENTS  STAINS USED IN DIAGNOSIS:  H&E-2  H&E-3  H&E-4  H&E  *RECUT 1 SLIDE  H&E  Stains used in diagnosis 1 Her2 by IHC, 1 ER-ACIS, 1 KI-67-ACIS, 1 PR-ACIS  IHC scores are reported using ASCO/CAP scoring criteria. An IHC Score of 0 or  1+ is NEGATIVE for HER2, 3+ is POSITIVE for HER2, and 2+ is EQUIVOCAL.  Equivocal results are reflexed to either FISH or IHC testing. Specimens are  fixed in 10% Neutral Buffered Formalin for at least 6 hours and up to 72  hours.  These tests have not be validated on decalcified tissue. Results should be  interpreted with caution given the possibility of false negative results on  decalcified specimens. Antibody Clone for HER2 is 4B5 ([facility]). Some of these  immunohistochemical stains may have been developed and the performance  characteristics determined by [facility]. Some may not have been  cleared or approved by the U.S. Food and Drug Administration. The FDA has  determined that such clearance or approval is not necessary. This test is used  for clinical purposes. It should not be regarded as investigational or for  research. This laboratory is certified under the Clinical Laboratory  Improvement Amendments of 1988 (CLIA-88) as qualified to perform high complexity  clinical laboratory testing.  Estrogen receptor (6F11), immunohistochemical stains are performed on formalin  fixed, paraffin embedded tissue using a 3,3"-diaminobenzidine (DAB) chromogen  and Leica Bond Autostainer System. The staining intensity of the nucleus is  scored manually and is reported as the percentage of tumor cell nuclei  demonstrating specific nuclear staining.Specimens are fixed in 10% Neutral  Buffered Formalin for at least 6 hours and up to 72 hours. These tests have not  be validated on decalcified tissue. Results should be interpreted with caution  given the possibility of false negative results on decalcified specimens.  Ki-67 (MM1), immunohistochemical stains are performed on formalin fixed,  paraffin embedded tissue using a 3,3"-diaminobenzidine (DAB) chromogen and Leica  Bond Autostainer System. The staining intensity of the nucleus is scored  manually and is reported as the percentage of tumor cell nuclei demonstrating  specific nuclear staining.Specimens are fixed in 10% Neutral Buffered Formalin  for at least 6 hours and up to 72 hours. These tests have not be validated on  decalcified tissue. Results  should be interpreted with caution given the  possibility of false negative results on decalcified specimens.  PR progesterone receptor (16), immunohistochemical stains are performed on  formalin fixed, paraffin embedded tissue using a 3,3"-diaminobenzidine (DAB)  chromogen and Leica Bond Autostainer System. The staining intensity of the  nucleus is scored manually and is reported as the percentage of tumor cell  nuclei demonstrating specific nuclear staining.Specimens are fixed in 10%  Neutral Buffered Formalin for at least 6 hours and up to 72 hours. These tests  have not be validated on decalcified tissue. Results should be interpreted with  caution given the possibility of false negative results on decalcified  specimens.   ADDENDUM  1A) Breast, left, needle core biopsy, 2:00 6cmfn (ribbon clip)  PROGNOSTIC INDICATORS   Results:  IMMUNOHISTOCHEMICAL AND MORPHOMETRIC ANALYSIS PERFORMED MANUALLY  The tumor cells are POSITIVE for Her2 (3+).  Estrogen Receptor: 90%, POSITIVE, MODERATE-STRONG STAINING INTENSITY  Progesterone Receptor: 95%, POSITIVE, STRONG STAINING INTENSITY  Proliferation Marker Ki67: 30%  REFERENCE RANGE ESTROGEN RECEPTOR  NEGATIVE 0%  POSITIVE =>1%  REFERENCE RANGE PROGESTERONE RECEPTOR  NEGATIVE 0%  POSITIVE =>1%  All controls stained appropriately  [place] Md, Fred , Sports administrator, International aid/development worker  ( Signed [phone])  CLINICAL DATA: 33 year old female presents with palpable LEFT  breast mass identified on self-examination. Baseline mammogram.   EXAM:  DIGITAL DIAGNOSTIC BILATERAL MAMMOGRAM WITH TOMOSYNTHESIS AND CAD;  ULTRASOUND RIGHT BREAST LIMITED; ULTRASOUND LEFT BREAST LIMITED   TECHNIQUE:  Bilateral digital diagnostic mammography and breast tomosynthesis  was performed. The images were evaluated with computer-aided  detection. ; Targeted ultrasound examination of the right breast was  performed; Targeted ultrasound examination of the left  breast was  performed.   COMPARISON: None available.   ACR Breast Density Category d: The breasts are extremely dense,  which lowers the sensitivity of mammography.   FINDINGS:  Full field views of both breasts and spot compression view of the  LEFT breast demonstrate an irregular mass within the UPPER OUTER  LEFT breast. A prominent LEFT axillary lymph node is noted.   UPPER OUTER RIGHT breast asymmetry is identified.   On physical exam, a firm palpable mass identified at the 2 o'clock  position of the LEFT breast 6 cm from the nipple.   Targeted ultrasound is performed, showing the following:   No sonographic abnormalities within the UPPER-OUTER RIGHT breast.   No abnormal RIGHT axillary lymph nodes identified.   A 3.1 x 2.5 x 2.5 cm irregular hypoechoic mass at the 2 o'clock  position of the LEFT breast 2 cm from the nipple.   Incidental benign cysts are noted at the 1:30 position of the LEFT  breast 4 cm from the nipple.   A single abnormal appearing LEFT axillary lymph node with cortical  thickness of 4.5 mm noted.   IMPRESSION:  1. 3.1 cm suspicious UPPER-OUTER LEFT breast mass. Ultrasound-guided  biopsy is recommended.  2. Single abnormal appearing LEFT axillary lymph node with cortical  thickening. Ultrasound-guided biopsy is recommended.  3. No other significant findings identified within either breast.   RECOMMENDATION:  Ultrasound-guided biopsy of UPPER-OUTER LEFT breast mass and  ultrasound-guided biopsy of abnormal LEFT axillary lymph node.   I have discussed the findings and recommendations with the patient.  If applicable, a reminder letter will be sent to the patient  regarding the next appointment.   BI-RADS CATEGORY 4: Suspicious.   Assessment and Plan:   Diagnoses and all orders for this visit:  Malignant neoplasm of upper-outer quadrant of left breast in female, estrogen receptor positive (CMS/HHS-HCC)   Discussed neoadjuvant chemotherapy  with port placement with MRI. The patient wishes to proceed with surgery but does not know if she wishes to have chemotherapy. She will think things over. If she also chemotherapy, they can be given in a neoadjuvant setting with a port with subsequent breast conserving surgery down the road. She does wish to conserve her breast though.The procedure has been discussed with the patient. Alternatives to surgery have been discussed with the patient. Risks of surgery include bleeding, Infection, Seroma formation, death, and the need for further surgery. The patient understands and wishes to proceed.   Sharlee Dawes, MD

## 2023-11-05 NOTE — Discharge Instructions (Signed)

## 2023-11-05 NOTE — Op Note (Signed)
 Preoperative diagnosis: Left breast cancer upper outer quadrant ER positive  Postoperative diagnosis: Same  Procedure: Left breast seed localized lumpectomy with deep left axillary sentinel lymph node mapping with mag trace  Surgeon: Sim Dryer, MD  Anesthesia LMA with 0.25% Marcaine  with epinephrine  EBL: 10 cc  Specimen: Left breast mass with seed and clip verified by Faxitron and additional medial and lateral margins.  All tissue was oriented with ink and sent to pathology.  3 left axillary sentinel nodes  Drains: None  Indications for procedure: The patient is a 33 year old female diagnosed with left breast cancer after feeling a lump in her left breast for a number of months.  She sought workup which included ultrasound core biopsy which showed grade 3 ER positive, PR positive, HER2/neu positive left breast cancer.  She opted out of neoadjuvant chemotherapy and wished to proceed with surgery first after being seen by medical oncology, radiation oncology and surgery.  All of her options were reviewed.  She presents today for breast conserving surgery.The procedure has been discussed with the patient. Alternatives to surgery have been discussed with the patient.  Risks of surgery include bleeding,  Infection,  Seroma formation, death,  and the need for further surgery.   The patient understands and wishes to proceed. Sentinel lymph node mapping and dissection has been discussed with the patient.  Risk of bleeding,  Infection,  Seroma formation,  Additional procedures,,  Shoulder weakness ,  Shoulder stiffness,  Nerve and blood vessel injury and reaction to the mapping dyes have been discussed.  Alternatives to surgery have been discussed with the patient.  The patient agrees to proceed.        Description of procedure: The patient was met in the holding area and questions were answered.  Of note she had seed placement as an outpatient.  The the left breast was marked as the correct  site.  2 cc of mag trace were injected after induction of general esthesia and timeout.  She was then prepped and draped in a sterile fashion formally.  A second timeout performed to verify proper patient, site and procedure.  Neoprobe used to identify left breast mass with seed and clip.  This was in the upper outer quadrant and a curvilinear incision was made over this.  Dissection was carried out and all tissue around the seed and clip were excised with a grossly negative margin.  Hemostasis achieved with cautery.  Clips were placed.  Imaging revealed the seed and clip to be present.  Deep tissue planes were approximated 3-0 Vicryl.  4 Monocryl used to close the skin in a subcuticular fashion.  The mag trace probe was used.  There is increased uptake and signal in the left axilla.  A 4 cm incision was made along the inferior hairline of the left axilla.  Dissection was carried into the deep left Eksir contents.  She had 3 nodes that were removed that were dark in nature that it taken up the tracer.  Some of this was secondary to tattoo ink I believe but there is enough of signal in these 3 nodes to verify these as a sentinel node.  The background did not show any spikes whatsoever.  The long thoracic nerve, thoracodorsal trunk and extra vein were all preserved.  Hemostasis achieved with cautery.  Irrigation used.  There is no bleeding.  The deep tissue planes were approximated with 3-0 Vicryl.  4 Monocryl was used to close the skin in a subcuticular fashion.  Dermabond applied.  Breast binder placed.  All counts found to be correct.  The patient was awoke extubated taken to recovery in satisfactory condition.

## 2023-11-05 NOTE — Anesthesia Procedure Notes (Signed)
 Anesthesia Regional Block: Pectoralis block   Pre-Anesthetic Checklist: , timeout performed,  Correct Patient, Correct Site, Correct Laterality,  Correct Procedure, Correct Position, site marked,  Risks and benefits discussed,  Surgical consent,  Pre-op evaluation,  At surgeon's request and post-op pain management  Laterality: Left  Prep: chloraprep       Needles:  Injection technique: Single-shot  Needle Type: Echogenic Needle     Needle Length: 9cm  Needle Gauge: 21     Additional Needles:   Procedures:,,,, ultrasound used (permanent image in chart),,    Narrative:  Start time: 11/05/2023 11:18 AM End time: 11/05/2023 11:25 AM Injection made incrementally with aspirations every 5 mL.  Performed by: Personally  Anesthesiologist: [name], MD  Additional Notes: No pain on injection. No increased resistance to injection. Injection made in 5cc increments.  Good needle visualization.  Patient tolerated procedure well.

## 2023-11-05 NOTE — Anesthesia Preprocedure Evaluation (Addendum)
 Anesthesia Evaluation  Patient identified by MRN, date of birth, ID band Patient awake    Reviewed: Allergy & Precautions, NPO status , Patient's Chart, lab work & pertinent test results  Airway Mallampati: II  TM Distance: >3 FB Neck ROM: Full    Dental  (+) Dental Advisory Given, Poor Dentition, Missing, Chipped   Pulmonary Current Smoker and Patient abstained from smoking.   Pulmonary exam normal breath sounds clear to auscultation       Cardiovascular negative cardio ROS Normal cardiovascular exam Rhythm:Regular Rate:Normal     Neuro/Psych negative neurological ROS     GI/Hepatic negative GI ROS, Neg liver ROS,,,  Endo/Other  negative endocrine ROS    Renal/GU negative Renal ROS     Musculoskeletal negative musculoskeletal ROS (+)    Abdominal   Peds  Hematology negative hematology ROS (+)   Anesthesia Other Findings Day of surgery medications reviewed with the patient.  Malignant neoplasm of upper-outer quadrant of left breast in female, estrogen receptor positive  Reproductive/Obstetrics                             Anesthesia Physical Anesthesia Plan  ASA: 2  Anesthesia Plan: General   Post-op Pain Management: Tylenol  PO (pre-op)*, Regional block* and Toradol IV (intra-op)*   Induction: Intravenous  PONV Risk Score and Plan: 2 and Midazolam, Dexamethasone and Ondansetron   Airway Management Planned: LMA  Additional Equipment:   Intra-op Plan:   Post-operative Plan: Extubation in OR  Informed Consent: I have reviewed the patients History and Physical, chart, labs and discussed the procedure including the risks, benefits and alternatives for the proposed anesthesia with the patient or authorized representative who has indicated his/her understanding and acceptance.     Dental advisory given  Plan Discussed with: CRNA  Anesthesia Plan Comments:          Anesthesia Quick Evaluation

## 2023-11-05 NOTE — Interval H&P Note (Signed)
 History and Physical Interval Note:  11/05/2023 11:48 AM  Donna [name]  has presented today for surgery, with the diagnosis of LEFT BREAST CANCER.  The various methods of treatment have been discussed with the patient and family. After consideration of risks, benefits and other options for treatment, the patient has consented to  Procedure(s) with comments: BREAST LUMPECTOMY WITH RADIOACTIVE SEED AND SENTINEL LYMPH NODE BIOPSY (Left) - LEFT BREAST SEED LUMPECTOMY, LEFT SENTINEL LYMPH NODE MAPPING as a surgical intervention.  The patient's history has been reviewed, patient examined, no change in status, stable for surgery.  I have reviewed the patient's chart and labs.  Questions were answered to the patient's satisfaction.     Keeton Kassebaum A Greyden Besecker

## 2023-11-05 NOTE — Anesthesia Procedure Notes (Signed)
 Procedure Name: LMA Insertion Date/Time: 11/05/2023 12:06 PM  Performed by: Candance Certain, CRNAPre-anesthesia Checklist: Patient identified, Emergency Drugs available, Suction available and Patient being monitored Patient Re-evaluated:Patient Re-evaluated prior to induction Oxygen Delivery Method: Circle System Utilized Preoxygenation: Pre-oxygenation with 100% oxygen Induction Type: IV induction Ventilation: Mask ventilation without difficulty LMA: LMA inserted LMA Size: 4.0 Number of attempts: 1 Airway Equipment and Method: Bite block Placement Confirmation: positive ETCO2 Tube secured with: Tape Dental Injury: Teeth and Oropharynx as per pre-operative assessment

## 2023-11-06 ENCOUNTER — Encounter (HOSPITAL_COMMUNITY): Payer: Self-pay | Admitting: Surgery

## 2023-11-06 NOTE — Anesthesia Postprocedure Evaluation (Signed)
 Anesthesia Post Note  Patient: Donna [name]  Procedure(s) Performed: BREAST LUMPECTOMY WITH RADIOACTIVE SEED (Left: Breast) BIOPSY, LYMPH NODE, SENTINEL (Left: Axilla)     Patient location during evaluation: PACU Anesthesia Type: General Level of consciousness: awake and alert Pain management: pain level controlled Vital Signs Assessment: post-procedure vital signs reviewed and stable Respiratory status: spontaneous breathing, nonlabored ventilation and respiratory function stable Cardiovascular status: blood pressure returned to baseline and stable Postop Assessment: no apparent nausea or vomiting Anesthetic complications: no   No notable events documented.  Last Vitals:  Vitals:   11/05/23 1430 11/05/23 1445  BP: 119/81 111/81  Pulse: (!) 51 (!) 42  Resp: 15 17  Temp:  36.7 C  SpO2: 98% 96%    Last Pain:  Vitals:   11/05/23 1445  TempSrc:   PainSc: 3                  [name]

## 2023-11-08 ENCOUNTER — Encounter: Payer: Self-pay | Admitting: Hematology and Oncology

## 2023-11-10 ENCOUNTER — Other Ambulatory Visit: Payer: Self-pay

## 2023-11-10 ENCOUNTER — Emergency Department (HOSPITAL_COMMUNITY)

## 2023-11-10 ENCOUNTER — Ambulatory Visit (HOSPITAL_COMMUNITY)
Admission: EM | Admit: 2023-11-10 | Discharge: 2023-11-10 | Disposition: A | Discharge status: Home / Self Care | Attending: Emergency Medicine | Admitting: Emergency Medicine

## 2023-11-10 ENCOUNTER — Encounter (HOSPITAL_COMMUNITY): Payer: Self-pay

## 2023-11-10 ENCOUNTER — Encounter (HOSPITAL_COMMUNITY): Payer: Self-pay | Admitting: Emergency Medicine

## 2023-11-10 ENCOUNTER — Emergency Department (HOSPITAL_COMMUNITY)
Admission: EM | Admit: 2023-11-10 | Discharge: 2023-11-10 | Disposition: A | Discharge status: Home / Self Care | Attending: Emergency Medicine | Admitting: Emergency Medicine

## 2023-11-10 DIAGNOSIS — R5383 Other fatigue: Secondary | ICD-10-CM

## 2023-11-10 DIAGNOSIS — G8918 Other acute postprocedural pain: Secondary | ICD-10-CM | POA: Diagnosis not present

## 2023-11-10 DIAGNOSIS — L7682 Other postprocedural complications of skin and subcutaneous tissue: Secondary | ICD-10-CM

## 2023-11-10 LAB — PROTIME-INR
INR: 1.1 (ref 0.8–1.2)
Prothrombin Time: 14.2 s (ref 11.4–15.2)

## 2023-11-10 LAB — URINALYSIS, W/ REFLEX TO CULTURE (INFECTION SUSPECTED)
Bilirubin Urine: NEGATIVE
Glucose, UA: NEGATIVE mg/dL
Hgb urine dipstick: NEGATIVE
Ketones, ur: NEGATIVE mg/dL
Leukocytes,Ua: NEGATIVE
Nitrite: NEGATIVE
Protein, ur: NEGATIVE mg/dL
Specific Gravity, Urine: 1.013 (ref 1.005–1.030)
pH: 7 (ref 5.0–8.0)

## 2023-11-10 LAB — COMPREHENSIVE METABOLIC PANEL WITH GFR
ALT: 23 U/L (ref 0–44)
AST: 28 U/L (ref 15–41)
Albumin: 4 g/dL (ref 3.5–5.0)
Alkaline Phosphatase: 32 U/L — ABNORMAL LOW (ref 38–126)
Anion gap: 9 (ref 5–15)
BUN: <5 mg/dL — ABNORMAL LOW (ref 6–20)
CO2: 23 mmol/L (ref 22–32)
Calcium: 9.2 mg/dL (ref 8.9–10.3)
Chloride: 108 mmol/L (ref 98–111)
Creatinine, Ser: 0.97 mg/dL (ref 0.44–1.00)
GFR, Estimated: >60 mL/min (ref >60)
Glucose, Bld: 81 mg/dL (ref 70–99)
Potassium: 3.9 mmol/L (ref 3.5–5.1)
Sodium: 140 mmol/L (ref 135–145)
Total Bilirubin: 0.6 mg/dL (ref 0.0–1.2)
Total Protein: 6.9 g/dL (ref 6.5–8.1)

## 2023-11-10 LAB — HCG, SERUM, QUALITATIVE: Preg, Serum: NEGATIVE

## 2023-11-10 LAB — CBC WITH DIFFERENTIAL/PLATELET
Abs Immature Granulocytes: 0.02 10*3/uL (ref 0.00–0.07)
Basophils Absolute: 0 10*3/uL (ref 0.0–0.1)
Basophils Relative: 1 %
Eosinophils Absolute: 0.6 10*3/uL — ABNORMAL HIGH (ref 0.0–0.5)
Eosinophils Relative: 11 %
HCT: 41.8 % (ref 36.0–46.0)
Hemoglobin: 13.8 g/dL (ref 12.0–15.0)
Immature Granulocytes: 0 %
Lymphocytes Relative: 30 %
Lymphs Abs: 1.8 10*3/uL (ref 0.7–4.0)
MCH: 28.9 pg (ref 26.0–34.0)
MCHC: 33 g/dL (ref 30.0–36.0)
MCV: 87.6 fL (ref 80.0–100.0)
Monocytes Absolute: 0.3 10*3/uL (ref 0.1–1.0)
Monocytes Relative: 5 %
Neutro Abs: 3.2 10*3/uL (ref 1.7–7.7)
Neutrophils Relative %: 53 %
Platelets: 178 10*3/uL (ref 150–400)
RBC: 4.77 MIL/uL (ref 3.87–5.11)
RDW: 12.6 % (ref 11.5–15.5)
WBC: 6 10*3/uL (ref 4.0–10.5)
nRBC: 0 % (ref 0.0–0.2)

## 2023-11-10 LAB — I-STAT CG4 LACTIC ACID, ED: Lactic Acid, Venous: 0.9 mmol/L (ref 0.5–1.9)

## 2023-11-10 MED ORDER — ONDANSETRON 4 MG PO TBDP
4.0000 mg | ORAL_TABLET | Freq: Once | ORAL | Status: AC
  Administered 2023-11-10: 4 mg via ORAL

## 2023-11-10 MED ORDER — CEPHALEXIN 500 MG PO CAPS: 500.0000 mg | ORAL_CAPSULE | Freq: Four times a day (QID) | ORAL | 0 refills | Status: DC

## 2023-11-10 MED ORDER — IOHEXOL 350 MG/ML SOLN
50.0000 mL | Freq: Once | INTRAVENOUS | Status: AC | PRN
  Administered 2023-11-10: 50 mL via INTRAVENOUS

## 2023-11-10 MED ORDER — ONDANSETRON 4 MG PO TBDP
ORAL_TABLET | ORAL | Status: AC
  Filled 2023-11-10: qty 1

## 2023-11-10 MED ORDER — KETOROLAC TROMETHAMINE 30 MG/ML IJ SOLN
30.0000 mg | Freq: Once | INTRAMUSCULAR | Status: AC
  Administered 2023-11-10: 30 mg via INTRAVENOUS
  Filled 2023-11-10: qty 1

## 2023-11-10 MED ORDER — CEPHALEXIN 250 MG PO CAPS
500.0000 mg | ORAL_CAPSULE | Freq: Once | ORAL | Status: AC
  Administered 2023-11-10: 500 mg via ORAL
  Filled 2023-11-10: qty 2

## 2023-11-10 NOTE — ED Provider Notes (Signed)
 [facility]    CSN: [id number] Arrival date & time: 11/10/23  1439      History   Chief Complaint Chief Complaint  Patient presents with   Post-op Problem    HPI Donna [name] is a 33 y.o. female.  Here with left axillary pain, drainage, fatigue, and nausea She had left breast lumpectomy 5 days ago on 6/3 History of stage 2 breast cancer Symptoms started today No fever but feels hot and chills  Called surgeon on call who recommended going to the ED, but she presented to urgent care   Past Medical History:  Diagnosis Date   Breast cancer [facility])     Patient Active Problem List   Diagnosis Date Noted   Genetic testing 09/13/2023   Malignant neoplasm of upper-outer quadrant of left breast in female, estrogen receptor positive (HCC) 09/02/2023   Acute lateral meniscus tear of right knee 12/15/2019    Past Surgical History:  Procedure Laterality Date   BREAST BIOPSY Left 08/23/2023   US  LT BREAST BX W LOC DEV 1ST LESION IMG BX SPEC US  GUIDE 08/23/2023 [facility]   BREAST BIOPSY Left 11/01/2023   US  LT RADIOACTIVE SEED LOC 11/01/2023 [facility]   BREAST LUMPECTOMY WITH RADIOACTIVE SEED AND SENTINEL LYMPH NODE BIOPSY Left 11/05/2023   Procedure: BREAST LUMPECTOMY WITH RADIOACTIVE SEED;  Surgeon: Sim Dryer, MD;  Location: [facility] OR;  Service: General;  Laterality: Left;  LEFT BREAST SEED LUMPECTOMY, LEFT SENTINEL LYMPH NODE MAPPING   SENTINEL NODE BIOPSY Left 11/05/2023   Procedure: BIOPSY, LYMPH NODE, SENTINEL;  Surgeon: Sim Dryer, MD;  Location: [facility] OR;  Service: General;  Laterality: Left;    OB History   No obstetric history on file.      Home Medications    Prior to Admission medications   Medication Sig Start Date End Date Taking? Authorizing Provider  APPLE CIDER VINEGAR ULTRA PO Take 1 capsule by mouth once a week.    [provider]  LORazepam  (ATIVAN ) 0.5 MG tablet Take 1 tablet (0.5 mg total) by mouth at bedtime as  needed for anxiety. 10/15/23   Gudena, Vinay, MD  Melatonin (SLEEP GUMMIES PO) Take 3 each by mouth at bedtime. OLLY Sleep Gummy, 3 mg Melatonin, L-Theanine, Chamomile, Lemon Balm, Sleep Aid, Teacher, music, Historical, MD  oxyCODONE  (OXY IR/ROXICODONE ) 5 MG immediate release tablet Take 1 tablet (5 mg total) by mouth every 6 (six) hours as needed for severe pain (pain score 7-10). 11/05/23   Cornett, Andy Bannister, MD  tamoxifen  (NOLVADEX ) 20 MG tablet Take 1 tablet (20 mg total) by mouth daily. 10/16/23   Gudena, Vinay, MD  potassium chloride  SA (KLOR-CON ) 20 MEQ tablet Take 1 tablet (20 mEq total) by mouth daily. 05/03/19 10/10/20  Hershel Los, MD    Family History Family History  Problem Relation Age of Onset   Breast cancer Maternal Grandmother        contralateral; first dx <50; second dx in 33s   Cancer - Other Half-Sister 85       sarcoma; d. 51    Social History Social History   Tobacco Use   Smoking status: Every Day    Current packs/day: 0.50    Average packs/day: 0.5 packs/day for 9.0 years (4.5 ttl pk-yrs)    Types: Cigars, Cigarettes   Smokeless tobacco: Never  Substance Use Topics   Alcohol use: Yes    Alcohol/week: 0.0 standard drinks of alcohol    Comment: weekends  Drug use: No     Allergies   Patient has no known allergies.   Review of Systems Review of Systems Per HPI  Physical Exam Triage Vital Signs ED Triage Vitals  Encounter Vitals Group     BP 11/10/23 1531 101/71     Systolic BP Percentile --      Diastolic BP Percentile --      Pulse Rate 11/10/23 1531 71     Resp 11/10/23 1531 18     Temp 11/10/23 1531 98.4 F (36.9 C)     Temp src --      SpO2 11/10/23 1531 97 %     Weight --      Height --      Head Circumference --      Peak Flow --      Pain Score 11/10/23 1532 7     Pain Loc --      Pain Education --      Exclude from Growth Chart --    No data found.  Updated Vital Signs BP 101/71 (BP Location: Right Arm)   Pulse 71    Temp 98.4 F (36.9 C)   Resp 18   LMP 10/18/2023 (Exact Date)   SpO2 97%   Visual Acuity Right Eye Distance:   Left Eye Distance:   Bilateral Distance:    Right Eye Near:   Left Eye Near:    Bilateral Near:     Physical Exam Vitals and nursing note reviewed.  Constitutional:      Appearance: Normal appearance. She is ill-appearing. She is not toxic-appearing or diaphoretic.     Comments: Patient is bent over in chair, ill appearing but non toxic   HENT:     Mouth/Throat:     Pharynx: Oropharynx is clear.  Cardiovascular:     Rate and Rhythm: Normal rate and regular rhythm.     Pulses: Normal pulses.     Heart sounds: Normal heart sounds.  Pulmonary:     Effort: Pulmonary effort is normal.     Breath sounds: Normal breath sounds.  Chest:       Comments: Surgical scar left axilla tender without erythema or drainage  Neurological:     Mental Status: She is alert and oriented to person, place, and time.     UC Treatments / Results  Labs (all labs ordered are listed, but only abnormal results are displayed) Labs Reviewed - No data to display  EKG  Radiology No results found.  Procedures Procedures (including critical care time)  Medications Ordered in UC Medications  ondansetron  (ZOFRAN -ODT) disintegrating tablet 4 mg (4 mg Oral Given 11/10/23 1543)    Initial Impression / Assessment and Plan / UC Course  I have reviewed the triage vital signs and the nursing notes.  Pertinent labs & imaging results that were available during my care of the patient were reviewed by me and considered in my medical decision making (see chart for details).  Afebrile in clinic. Stable vitals.  Pain at surgical site, reported drainage. New onset fatigue, chills, nausea 5 days after lumpectomy, breast cancer stage 2.  Zofran  ODT given in clinic  With patient history and recent surgery, have asked her to proceed to the ED as previously directed by her surgeon. She is sent to the  ED across the parking lot via POV  Final Clinical Impressions(s) / UC Diagnoses   Final diagnoses:  Post-operative pain  Other fatigue   Discharge Instructions   None  ED Prescriptions   None    PDMP not reviewed this encounter.   Loyalty Arentz, Ivette Marks, [place] 11/10/23 1544

## 2023-11-10 NOTE — ED Triage Notes (Signed)
 Pt reports had breast surgery last Tuesday where had lymph nodes removed. Reports having drainage, feeling nauseated and fatigued.

## 2023-11-10 NOTE — Discharge Instructions (Signed)
 You have been seen and discharged from the emergency department.  Your blood work was normal.  Your CT scan showed most likely an area of bleeding around the surgical site but no findings of abscess at this time.  The general surgeon that I consulted recommends that we placed on oral antibiotics.  Your first dose was given here.  Use the oxycodone  as prescribed for pain control.  Follow-up in the general surgery office this week for reevaluation.  Follow-up with your primary provider for further evaluation and further care. Take home medications as prescribed. If you have any worsening symptoms or further concerns for your health please return to an emergency department for further evaluation.

## 2023-11-10 NOTE — ED Notes (Signed)
 Patient is being discharged from the Urgent Care and sent to the Emergency Department via POV. Per [employer], PA, patient is in need of higher level of care due to post-op problem, drainage and pain. Patient is aware and verbalizes understanding of plan of care.  Vitals:   11/10/23 1531  BP: 101/71  Pulse: 71  Resp: 18  Temp: 98.4 F (36.9 C)  SpO2: 97%

## 2023-11-10 NOTE — ED Provider Notes (Signed)
 Donna [name] Provider Note   CSN: [id number] Arrival date & time: 11/10/23  4098     History  Chief Complaint  Patient presents with   Post-op Problem    Donna [name] is a 33 y.o. female.  HPI   33 year old female with lumpectomy and lymph node resection on the left 5 days ago with Dr. Afton Horse presents emergency department with pain from the axillary incision site and yellow/white drainage.  She denies any fevers but she endorses chills and generalized unwell feeling.  States that the arm is becoming more painful.  She called surgery who referred her to come in for more emergent evaluation.  Denies any numbness or tingling of the left upper extremity.  No other chest pain or shortness of breath.  Home Medications Prior to Admission medications   Medication Sig Start Date End Date Taking? Authorizing Provider  APPLE CIDER VINEGAR ULTRA PO Take 1 capsule by mouth once a week.    [provider]  LORazepam  (ATIVAN ) 0.5 MG tablet Take 1 tablet (0.5 mg total) by mouth at bedtime as needed for anxiety. 10/15/23   Gudena, Vinay, MD  Melatonin (SLEEP GUMMIES PO) Take 3 each by mouth at bedtime. OLLY Sleep Gummy, 3 mg Melatonin, L-Theanine, Chamomile, Lemon Balm, Sleep Aid, Teacher, music, Historical, MD  oxyCODONE  (OXY IR/ROXICODONE ) 5 MG immediate release tablet Take 1 tablet (5 mg total) by mouth every 6 (six) hours as needed for severe pain (pain score 7-10). 11/05/23   Cornett, Andy Bannister, MD  tamoxifen  (NOLVADEX ) 20 MG tablet Take 1 tablet (20 mg total) by mouth daily. 10/16/23   Gudena, Vinay, MD  potassium chloride  SA (KLOR-CON ) 20 MEQ tablet Take 1 tablet (20 mEq total) by mouth daily. 05/03/19 10/10/20  Hershel Los, MD      Allergies    Patient has no known allergies.    Review of Systems   Review of Systems  Constitutional:  Negative for fever.  Respiratory:  Negative for shortness of breath.   Cardiovascular:   Negative for chest pain.  Gastrointestinal:  Negative for abdominal pain, diarrhea and vomiting.  Musculoskeletal:        Left axillary incisional site pain and drainage  Skin:  Negative for rash.  Neurological:  Negative for headaches.    Physical Exam Updated Vital Signs BP 102/70 (BP Location: Right Arm)   Pulse (!) 57   Temp 98.7 F (37.1 C)   Resp 15   Ht 6\' 4"  (1.93 m)   Wt 100 kg   LMP 10/18/2023 (Exact Date)   SpO2 100%   BMI 26.84 kg/m  Physical Exam Vitals and nursing note reviewed.  Constitutional:      Appearance: Normal appearance.  HENT:     Head: Normocephalic.     Mouth/Throat:     Mouth: Mucous membranes are moist.  Cardiovascular:     Rate and Rhythm: Normal rate.  Pulmonary:     Effort: Pulmonary effort is normal. No respiratory distress.  Abdominal:     Palpations: Abdomen is soft.     Tenderness: There is no abdominal tenderness.  Musculoskeletal:     Comments: Left axillary incision is clean, well-approximated, slight induration and pain to palpation but no expressed drainage or fluctuance  Skin:    General: Skin is warm.  Neurological:     Mental Status: She is alert and oriented to person, place, and time. Mental status is at baseline.  Psychiatric:  Mood and Affect: Mood normal.     ED Results / Procedures / Treatments   Labs (all labs ordered are listed, but only abnormal results are displayed) Labs Reviewed  URINALYSIS, W/ REFLEX TO CULTURE (INFECTION SUSPECTED) - Abnormal; Notable for the following components:      Result Value   APPearance HAZY (*)    Bacteria, UA RARE (*)    All other components within normal limits  CBC WITH DIFFERENTIAL/PLATELET - Abnormal; Notable for the following components:   Eosinophils Absolute 0.6 (*)    All other components within normal limits  COMPREHENSIVE METABOLIC PANEL WITH GFR - Abnormal; Notable for the following components:   BUN <5 (*)    Alkaline Phosphatase 32 (*)    All other  components within normal limits  CULTURE, BLOOD (ROUTINE X 2)  CULTURE, BLOOD (ROUTINE X 2)  HCG, SERUM, QUALITATIVE  PROTIME-INR  CBC WITH DIFFERENTIAL/PLATELET  I-STAT CG4 LACTIC ACID, ED  I-STAT CG4 LACTIC ACID, ED    EKG None  Radiology CT Chest W Contrast Result Date: 11/10/2023 CLINICAL DATA:  Known invasive breast carcinoma EXAM: CT CHEST WITH CONTRAST TECHNIQUE: Multidetector CT imaging of the chest was performed during intravenous contrast administration. RADIATION DOSE REDUCTION: This exam was performed according to the departmental dose-optimization program which includes automated exposure control, adjustment of the mA and/or kV according to patient size and/or use of iterative reconstruction technique. CONTRAST:  50mL OMNIPAQUE IOHEXOL 350 MG/ML SOLN COMPARISON:  None Available. FINDINGS: Cardiovascular: Thoracic aorta shows no aneurysmal dilatation or dissection. No coronary calcifications are seen. Heart is at the upper limits of normal in size. Pulmonary artery is not well opacified. Mediastinum/Nodes: Thoracic inlet is within normal limits. The esophagus is within normal limits. No hilar or mediastinal adenopathy is noted. Prominent soft tissue is noted in the left axilla measuring up to 6.2 x 2.8 cm. A biopsy clip is noted within consistent with the recent history of biopsy. Lungs/Pleura: The lungs are well aerated bilaterally. No focal infiltrate or sizable effusion is seen. No parenchymal nodules are noted. Upper Abdomen: Visualized upper abdomen is within normal limits. Musculoskeletal: No acute bony abnormality is noted. Postsurgical changes in the left breast are noted consistent with the given clinical history. Clips are noted related to recent lumpectomy. IMPRESSION: Changes consistent with the known history of prior left breast lumpectomy. Soft tissue prominence is noted in the left axilla which may simply represent postoperative hemorrhage following recent sentinel node  dissection. Biopsy clip is noted within. No other focal abnormality is noted. Electronically Signed   By: Violeta Grey M.D.   On: 11/10/2023 19:44    Procedures Procedures    Medications Ordered in ED Medications  ketorolac  (TORADOL ) 30 MG/ML injection 30 mg (30 mg Intravenous Given 11/10/23 1912)  iohexol (OMNIPAQUE) 350 MG/ML injection 50 mL (50 mLs Intravenous Contrast Given 11/10/23 1931)    ED Course/ Medical Decision Making/ A&P                                 Medical Decision Making Amount and/or Complexity of Data Reviewed Radiology: ordered.  Risk Prescription drug management.   33 year old female presents emergency department with concern for incisional pain status post lumpectomy/lymph node dissection 5 days ago.  States that she has been having serous/pustular drainage.  Endorses chills and weakness.  Incision site is clean, there is mild induration around it, tenderness but no fluctuance or drainage expressed,  unable to get a culture.  Vitals normal stable on arrival.  Blood work is normal.  CT scan identifies an area that looks most likely like hemorrhage and less likely abscess.  Consulted with general surgery, Dr. Jamse Mcgee.  She recommends oral antibiotics and outpatient follow-up in the office.  Patient has her oxycodone  prescription from surgery that she will use for pain control.  Patient at this time appears safe and stable for discharge and close outpatient follow up. Discharge plan and strict return to ED precautions discussed, patient verbalizes understanding and agreement.        Final Clinical Impression(s) / ED Diagnoses Final diagnoses:  None    Rx / DC Orders ED Discharge Orders     None         Flonnie Humphrey, DO 11/10/23 2154

## 2023-11-10 NOTE — ED Triage Notes (Signed)
 Pt reports lumpectomy 5 days ago. Pt reports yellow drainage from axillary incision. No fevers. +Chills, swelling, and pain.

## 2023-11-11 ENCOUNTER — Encounter: Payer: Self-pay | Admitting: *Deleted

## 2023-11-11 DIAGNOSIS — C50412 Malignant neoplasm of upper-outer quadrant of left female breast: Secondary | ICD-10-CM

## 2023-11-11 LAB — SURGICAL PATHOLOGY

## 2023-11-12 ENCOUNTER — Telehealth: Payer: Self-pay | Admitting: Radiation Oncology

## 2023-11-12 ENCOUNTER — Encounter

## 2023-11-12 ENCOUNTER — Encounter: Payer: Self-pay | Admitting: *Deleted

## 2023-11-12 ENCOUNTER — Telehealth: Payer: Self-pay | Admitting: *Deleted

## 2023-11-12 NOTE — Telephone Encounter (Signed)
 Called patient to schedule a consultation with Dr. Jeryl Moris. Patient stated she is not interested in proceeding with Radiation treatments. Closing referral at this time, Dr. Maia Schwartz office notified.

## 2023-11-12 NOTE — Telephone Encounter (Signed)
 Received notification from Dr. Jeryl Moris scheduler that pt has declined xrt and appt. Physician team notified.

## 2023-11-13 ENCOUNTER — Encounter

## 2023-11-14 LAB — CULTURE, BLOOD (ROUTINE X 2)
Culture: NO GROWTH
Culture: NO GROWTH
Special Requests: ADEQUATE
Special Requests: ADEQUATE

## 2023-11-28 ENCOUNTER — Encounter: Payer: Self-pay | Admitting: Hematology and Oncology

## 2023-11-28 ENCOUNTER — Inpatient Hospital Stay: Admitting: Hematology and Oncology

## 2023-11-28 ENCOUNTER — Inpatient Hospital Stay

## 2023-11-28 VITALS — BP 118/69 | HR 60 | Temp 97.3°F | Resp 18 | Ht 76.0 in | Wt 212.7 lb

## 2023-11-28 DIAGNOSIS — Z170 Estrogen receptor positive status [ER+]: Secondary | ICD-10-CM

## 2023-11-28 DIAGNOSIS — C50412 Malignant neoplasm of upper-outer quadrant of left female breast: Secondary | ICD-10-CM

## 2023-11-28 MED ORDER — SODIUM CHLORIDE 0.9 % IV SOLN: INTRAVENOUS | Status: DC

## 2023-11-28 MED ORDER — ACETAMINOPHEN 325 MG PO TABS
650.0000 mg | ORAL_TABLET | Freq: Once | ORAL | Status: AC
  Administered 2023-11-28: 650 mg via ORAL
  Filled 2023-11-28: qty 2

## 2023-11-28 MED ORDER — DIPHENHYDRAMINE HCL 25 MG PO CAPS
25.0000 mg | ORAL_CAPSULE | Freq: Once | ORAL | Status: AC
  Administered 2023-11-28: 25 mg via ORAL
  Filled 2023-11-28: qty 1

## 2023-11-28 MED ORDER — TRASTUZUMAB-ANNS CHEMO 150 MG IV SOLR
6.0000 mg/kg | Freq: Once | INTRAVENOUS | Status: AC
  Administered 2023-11-28: 600 mg via INTRAVENOUS
  Filled 2023-11-28: qty 28.57

## 2023-11-28 NOTE — Patient Instructions (Signed)
 [facility]  Discharge Instructions: Thank you for choosing [facility] to provide your oncology and hematology care.   If you have a lab appointment with the Cancer Center, please go directly to the Cancer Center and check in at the registration area.   Wear comfortable clothing and clothing appropriate for easy access to any Portacath or PICC line.   We strive to give you quality time with your provider. You may need to reschedule your appointment if you arrive late (15 or more minutes).  Arriving late affects you and other patients whose appointments are after yours.  Also, if you miss three or more appointments without notifying the office, you may be dismissed from the clinic at the provider's discretion.      For prescription refill requests, have your pharmacy contact our office and allow 72 hours for refills to be completed.    Today you received the following chemotherapy and/or immunotherapy agents: Trastuzumab      To help prevent nausea and vomiting after your treatment, we encourage you to take your nausea medication as directed.  BELOW ARE SYMPTOMS THAT SHOULD BE REPORTED IMMEDIATELY: *FEVER GREATER THAN 100.4 F (38 C) OR HIGHER *CHILLS OR SWEATING *NAUSEA AND VOMITING THAT IS NOT CONTROLLED WITH YOUR NAUSEA MEDICATION *UNUSUAL SHORTNESS OF BREATH *UNUSUAL BRUISING OR BLEEDING *URINARY PROBLEMS (pain or burning when urinating, or frequent urination) *BOWEL PROBLEMS (unusual diarrhea, constipation, pain near the anus) TENDERNESS IN MOUTH AND THROAT WITH OR WITHOUT PRESENCE OF ULCERS (sore throat, sores in mouth, or a toothache) UNUSUAL RASH, SWELLING OR PAIN  UNUSUAL VAGINAL DISCHARGE OR ITCHING   Items with * indicate a potential emergency and should be followed up as soon as possible or go to the Emergency Department if any problems should occur.  Please show the CHEMOTHERAPY ALERT CARD or  IMMUNOTHERAPY ALERT CARD at check-in to the Emergency Department and triage nurse.  Should you have questions after your visit or need to cancel or reschedule your appointment, please contact [facility]  Dept: [phone]  and follow the prompts.  Office hours are 8:00 a.m. to 4:30 p.m. Monday - Friday. Please note that voicemails left after 4:00 p.m. may not be returned until the following business day.  We are closed weekends and major holidays. You have access to a nurse at all times for urgent questions. Please call the main number to the clinic Dept: [phone] and follow the prompts.   For any non-urgent questions, you may also contact your provider using MyChart. We now offer e-Visits for anyone 80 and older to request care online for non-urgent symptoms. For details visit [email or web address].   Also download the MyChart app! Go to the app store, search "MyChart", open the app, select [place], and log in with your MyChart username and password.  Trastuzumab Injection What is this medication? TRASTUZUMAB (tras TOO zoo mab) treats breast cancer and stomach cancer. It works by blocking a protein that causes cancer cells to grow and multiply. This helps to slow or stop the spread of cancer cells. This medicine may be used for other purposes; ask your health care provider or pharmacist if you have questions. COMMON BRAND NAME(S): Herceptin, HERCESSI, Herzuma, KANJINTI, Ogivri, Ontruzant, Trazimera What should I tell my care team before I take this medication? They need to know if you have any of  these conditions: Heart failure Lung disease An unusual or allergic reaction to trastuzumab, other medications, foods, dyes, or preservatives Pregnant or trying to get pregnant Breast-feeding How should I use this medication? This medication is injected into a vein. It is given by your care team in a hospital or clinic setting. Talk  to your care team about the use of this medication in children. It is not approved for use in children. Overdosage: If you think you have taken too much of this medicine contact a poison control center or emergency room at once. NOTE: This medicine is only for you. Do not share this medicine with others. What if I miss a dose? Keep appointments for follow-up doses. It is important not to miss your dose. Call your care team if you are unable to keep an appointment. What may interact with this medication? Certain types of chemotherapy, such as daunorubicin, doxorubicin, epirubicin, idarubicin This list may not describe all possible interactions. Give your health care provider a list of all the medicines, herbs, non-prescription drugs, or dietary supplements you use. Also tell them if you smoke, drink alcohol, or use illegal drugs. Some items may interact with your medicine. What should I watch for while using this medication? Your condition will be monitored carefully while you are receiving this medication. This medication may make you feel generally unwell. This is not uncommon, as chemotherapy affects healthy cells as well as cancer cells. Report any side effects. Continue your course of treatment even though you feel ill unless your care team tells you to stop. This medication may increase your risk of getting an infection. Call your care team for advice if you get a fever, chills, sore throat, or other symptoms of a cold or flu. Do not treat yourself. Try to avoid being around people who are sick. Avoid taking medications that contain aspirin, acetaminophen, ibuprofen, naproxen, or ketoprofen unless instructed by your care team. These medications can hide a fever. Talk to your care team if you may be pregnant. Serious birth defects can occur if you take this medication during pregnancy and for 7 months after the last dose. You will need a negative pregnancy test before starting this medication.  Contraception is recommended while taking this medication and for 7 months after the last dose. Your care team can help you find the option that works for you. Do not breastfeed while taking this medication and for 7 months after stopping treatment. What side effects may I notice from receiving this medication? Side effects that you should report to your care team as soon as possible: Allergic reactions or angioedema--skin rash, itching or hives, swelling of the face, eyes, lips, tongue, arms, or legs, trouble swallowing or breathing Dry cough, shortness of breath or trouble breathing Heart failure--shortness of breath, swelling of the ankles, feet, or hands, sudden weight gain, unusual weakness or fatigue Infection--fever, chills, cough, or sore throat Infusion reactions--chest pain, shortness of breath or trouble breathing, feeling faint or lightheaded Side effects that usually do not require medical attention (report to your care team if they continue or are bothersome): Diarrhea Dizziness Headache Nausea Trouble sleeping Vomiting This list may not describe all possible side effects. Call your doctor for medical advice about side effects. You may report side effects to FDA at 1-800-FDA-1088. Where should I keep my medication? This medication is given in a hospital or clinic. It will not be stored at home. NOTE: This sheet is a summary. It may not cover all possible information.  If you have questions about this medicine, talk to your doctor, pharmacist, or health care provider.  2024 Elsevier/Gold Standard (2021-10-03 00:00:00)

## 2023-11-28 NOTE — Progress Notes (Signed)
 Patient Care Team: Patient, No Pcp Per as PCP - General (General Practice) Glean Stephane BROCKS, RN (Inactive) as Oncology Nurse Navigator Tyree Nanetta SAILOR, RN as Oncology Nurse Navigator Vanderbilt Ned, MD as Consulting Physician (General Surgery) Odean Potts, MD as Consulting Physician (Hematology and Oncology) Dewey Rush, MD as Consulting Physician (Radiation Oncology)  DIAGNOSIS:  Encounter Diagnosis  Name Primary?   Malignant neoplasm of upper-outer quadrant of left breast in female, estrogen receptor positive (HCC) Yes    SUMMARY OF ONCOLOGIC HISTORY: Oncology History  Malignant neoplasm of upper-outer quadrant of left breast in female, estrogen receptor positive (HCC)  08/23/2023 Initial Diagnosis   Palpable left breast mass at 2 o'clock position measuring 3.1 cm, 1 axillary lymph node biopsy: Concordant, D density breast, biopsy: Grade 2 IDC ER 90%, PR 95%, Ki67 30%, HER2 3+ positive   09/04/2023 Cancer Staging   Staging form: Breast, AJCC 8th Edition - Clinical: Stage IB (cT2, cN0, cM0, G2, ER+, PR+, HER2+) - Signed by Odean Potts, MD on 09/04/2023 Stage prefix: Initial diagnosis Histologic grading system: 3 grade system   09/12/2023 Genetic Testing   Negative [facility] CancerNext-Expanded +RNAinsight Panel.  Report date is 09/13/2023.   The CancerNext-Expanded gene panel offered by [facility] and includes sequencing, rearrangement, and RNA analysis for the following 76 genes: AIP, ALK, APC, ATM, AXIN2, BAP1, BARD1, BMPR1A, BRCA1, BRCA2, BRIP1, CDC73, CDH1, CDK4, CDKN1B, CDKN2A, CEBPA, CHEK2, CTNNA1, DDX41, DICER1, ETV6, FH, FLCN, GATA2, LZTR1, MAX, MBD4, MEN1, MET, MLH1, MSH2, MSH3, MSH6, MUTYH, NF1, NF2, NTHL1, PALB2, PHOX2B, PMS2, POT1, PRKAR1A, PTCH1, PTEN, RAD51C, RAD51D, RB1, RET, RUNX1, SDHA, SDHAF2, SDHB, SDHC, SDHD, SMAD4, SMARCA4, SMARCB1, SMARCE1, STK11, SUFU, TMEM127, TP53, TSC1, TSC2, VHL, and WT1 (sequencing and deletion/duplication); EGFR, HOXB13, KIT, MITF, PDGFRA,  POLD1, and POLE (sequencing only); EPCAM and GREM1 (deletion/duplication only).    10/14/2023 -  Chemotherapy   Patient is on Treatment Plan : BREAST MAINTENANCE Trastuzumab  IV (6) or SQ (600) D1 q21d X 11 Cycles     11/05/2023 Surgery   Left lumpectomy: Grade 3 IDC 3 cm with high-grade DCIS, margins negative, 0/4 lymph nodes negative, ER 90%, PR 95%, HER2 3+ positive, Ki67 30%   11/28/2023 Cancer Staging   Staging form: Breast, AJCC 8th Edition - Pathologic: Stage IA (pT2, pN0, cM0, G3, ER+, PR+, HER2+) - Signed by Odean Potts, MD on 11/28/2023 Histologic grading system: 3 grade system     CHIEF COMPLIANT: Follow-up after surgery to discuss pathology report  HISTORY OF PRESENT ILLNESS:   History of Present Illness Donna [name] is a 33 year old with a history of cancer who presents for follow-up after treatment.  Her tumor was initially three centimeters and was completely removed. Four lymph nodes were excised, all clear of cancer. Surgical margins are negative, and her cancer is now staged at 1A, improved from 1B.  She is on tamoxifen  and Herceptin , with Herceptin  administered every three weeks for a year. She prefers IV infusion over injection due to concerns about pain at the injection site.     ALLERGIES:  has no known allergies.  MEDICATIONS:  Current Outpatient Medications  Medication Sig Dispense Refill   APPLE CIDER VINEGAR ULTRA PO Take 1 capsule by mouth once a week.     cephALEXin  (KEFLEX ) 500 MG capsule Take 1 capsule (500 mg total) by mouth 4 (four) times daily. 20 capsule 0   LORazepam  (ATIVAN ) 0.5 MG tablet Take 1 tablet (0.5 mg total) by mouth at bedtime as needed  for anxiety. 30 tablet 0   Melatonin (SLEEP GUMMIES PO) Take 3 each by mouth at bedtime. OLLY Sleep Gummy, 3 mg Melatonin, L-Theanine, Chamomile, Lemon Balm, Sleep Aid, Blackberry     oxyCODONE  (OXY IR/ROXICODONE ) 5 MG immediate release tablet Take 1 tablet (5 mg total) by mouth every 6 (six)  hours as needed for severe pain (pain score 7-10). 15 tablet 0   sulfamethoxazole -trimethoprim  (BACTRIM  DS) 800-160 MG tablet Take 1 tablet by mouth.     tamoxifen  (NOLVADEX ) 20 MG tablet Take 1 tablet (20 mg total) by mouth daily. 90 tablet 3   No current facility-administered medications for this visit.   Facility-Administered Medications Ordered in Other Visits  Medication Dose Route Frequency Provider Last Rate Last Admin   0.9 %  sodium chloride  infusion   Intravenous Continuous Odean Potts, MD   Stopped at 11/28/23 1419    PHYSICAL EXAMINATION: ECOG PERFORMANCE STATUS: 1 - Symptomatic but completely ambulatory  Vitals:   11/28/23 1223  BP: 118/69  Pulse: 60  Resp: 18  Temp: (!) 97.3 F (36.3 C)  SpO2: 100%   Filed Weights   11/28/23 1223  Weight: 212 lb 11.2 oz (96.5 kg)    LABORATORY DATA:  I have reviewed the data as listed    Latest Ref Rng & Units 11/10/2023    5:32 PM 10/14/2023    8:34 AM 09/04/2023   12:33 PM  CMP  Glucose 70 - 99 mg/dL 81  98  87   BUN 6 - 20 mg/dL 5  8  8    Creatinine 0.44 - 1.00 mg/dL 9.02  9.04  9.14   Sodium 135 - 145 mmol/L 140  141  139   Potassium 3.5 - 5.1 mmol/L 3.9  3.3  4.4   Chloride 98 - 111 mmol/L 108  111  106   CO2 22 - 32 mmol/L 23  25  30    Calcium 8.9 - 10.3 mg/dL 9.2  8.3  9.2   Total Protein 6.5 - 8.1 g/dL 6.9  6.3  7.2   Total Bilirubin 0.0 - 1.2 mg/dL 0.6  0.3  0.5   Alkaline Phos 38 - 126 U/L 32  45  47   AST 15 - 41 U/L 28  25  16    ALT 0 - 44 U/L 23  33  13     Lab Results  Component Value Date   WBC 6.0 11/10/2023   HGB 13.8 11/10/2023   HCT 41.8 11/10/2023   MCV 87.6 11/10/2023   PLT 178 11/10/2023   NEUTROABS 3.2 11/10/2023    ASSESSMENT & PLAN:  Malignant neoplasm of upper-outer quadrant of left breast in female, estrogen receptor positive (HCC) 08/23/2023:Palpable left breast mass at 2 o'clock position measuring 3.1 cm, 1 axillary lymph node biopsy: Concordant, D density breast, biopsy: Grade 2  IDC ER 90%, PR 95%, Ki67 30%, HER2 3+ positive    Recommendation  1. Neoadjuvant chemotherapy with TCH Perjeta 6 cycles followed by Herceptin  Perjeta maintenance versus Kadcyla maintenance (based on response to neoadjuvant chemo) for 1 year (patient decided to not proceed with chemo because of fear of losing employment), patient is starting tamoxifen  with Herceptin  injections on 10/11/2023 2. Followed by breast conserving surgery if possible with sentinel lymph node study 3. Followed by adjuvant radiation therapy if patient had lumpectomy Genetic testing ----------------------------------------------------------------------- New treatment plan: Herceptin  injections every [redacted] weeks along with tamoxifen  as neoadjuvant treatment.  (Started 10/14/2023) 11/05/2023 left lumpectomy: Grade 3 IDC 3 cm  with high-grade DCIS, margins negative, 0/4 lymph nodes negative, ER 90%, PR 95%, HER2 3+ positive, Ki67 30%   Tamoxifen  toxicities: Tolerating tamoxifen  extremely well without any problems.    Return to clinic every 3 weeks for Herceptin .  Patient is not interested in doing chemotherapy and therefore we decided to treat her with Herceptin  and tamoxifen  instead.  No orders of the defined types were placed in this encounter.  The patient has a good understanding of the overall plan. she agrees with it. she will call with any problems that may develop before the next visit here. Total time spent: 30 mins including face to face time and time spent for planning, charting and co-ordination of care   Donna MARLA Chad, MD 11/28/23

## 2023-11-28 NOTE — Assessment & Plan Note (Addendum)
 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 Neoadjuvant chemotherapy with TCH Perjeta 6 cycles followed by Herceptin  Perjeta maintenance versus Kadcyla maintenance (based on response to neoadjuvant chemo) for 1 year (patient decided to not proceed with chemo because of fear of losing employment), patient is starting tamoxifen  with Herceptin  injections on 10/11/2023 2. Followed by breast conserving surgery if possible with sentinel lymph node study 3. Followed by adjuvant radiation therapy if patient had lumpectomy Genetic testing ----------------------------------------------------------------------- New treatment plan: Herceptin  injections every [redacted] weeks along with tamoxifen  as neoadjuvant treatment.  (Started 10/14/2023) 11/05/2023 left lumpectomy: Grade 3 IDC 3 cm with high-grade DCIS, margins negative, 0/4 lymph nodes negative, ER 90%, PR 95%, HER2 3+ positive, Ki67 30%   Tamoxifen  toxicities: Tolerating tamoxifen  extremely well without any problems.    Return to clinic every 3 weeks for Herceptin .

## 2023-12-09 NOTE — Therapy (Signed)
 OUTPATIENT PHYSICAL THERAPY BREAST CANCER POST OP FOLLOW UP   Patient Name: Donna [name] MRN: [id number] DOB:March 15, 1991, 33 y.o., female Today's Date: 12/10/2023  END OF SESSION:  PT End of Session - 12/10/23 1506     Visit Number 2    Number of Visits 2    Date for PT Re-Evaluation 12/10/23    PT Start Time 1507    PT Stop Time 1545    PT Time Calculation (min) 38 min    Activity Tolerance Patient tolerated treatment well    Behavior During Therapy [facility] for tasks assessed/performed          Past Medical History:  Diagnosis Date   Breast cancer [facility])    Past Surgical History:  Procedure Laterality Date   BREAST BIOPSY Left 08/23/2023   US  LT BREAST BX W LOC DEV 1ST LESION IMG BX SPEC US  GUIDE 08/23/2023 [facility] MAMMOGRAPHY   BREAST BIOPSY Left 11/01/2023   US  LT RADIOACTIVE SEED LOC 11/01/2023 [facility] MAMMOGRAPHY   BREAST LUMPECTOMY WITH RADIOACTIVE SEED AND SENTINEL LYMPH NODE BIOPSY Left 11/05/2023   Procedure: BREAST LUMPECTOMY WITH RADIOACTIVE SEED;  Surgeon: Vanderbilt Ned, MD;  Location: [facility] OR;  Service: General;  Laterality: Left;  LEFT BREAST SEED LUMPECTOMY, LEFT SENTINEL LYMPH NODE MAPPING   SENTINEL NODE BIOPSY Left 11/05/2023   Procedure: BIOPSY, LYMPH NODE, SENTINEL;  Surgeon: Vanderbilt Ned, MD;  Location: [facility] OR;  Service: General;  Laterality: Left;   Patient Active Problem List   Diagnosis Date Noted   Genetic testing 09/13/2023   Malignant neoplasm of upper-outer quadrant of left breast in female, estrogen receptor positive (HCC) 09/02/2023   Acute lateral meniscus tear of right knee 12/15/2019    PCP:   REFERRING PROVIDER: Dr. Jina Nephew   REFERRING DIAG: Left breast cancer   THERAPY DIAG:  Malignant neoplasm of upper-outer quadrant of left breast in female, estrogen receptor positive (HCC)  Abnormal posture  Acute pain of right shoulder  Rationale for Evaluation and Treatment: Rehabilitation  ONSET DATE: 08/23/2023  SUBJECTIVE:                                                                                                                                                                                            SUBJECTIVE STATEMENT: Pt has been doing her exercises. She thinks ROM is doing well. Able to sleep on the left. She does not see any swelling in her breast. She had it drained after seeing the MD and She had 3 different antibiotics. 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  PERTINENT HISTORY:  Patient was diagnosed on 08/23/2023  with left grade 2 invasive ductal carcinoma breast cancer. It measured 3.1 cm and was located in the upper outer quadrant. It was triple positive with a Ki67 of 30%. Pt is s/p Left lumpectomy with SLNB on 11/05/2023. Was seen in the ED on 11/10/2023 for incisional pain with drainage and placed on antibiotics.  Declined radiation.  PATIENT GOALS:  Reassess how my recovery is going related to arm function, pain, and swelling.  PAIN:  Are you having pain? Yes: NPRS scale: 0-2/10 Pain location: Left axilla Pain description: tight, sharp Aggravating factors: extremes of reaching Relieving factors: not reaching to extremes  PRECAUTIONS: Recent Surgery, left UE Lymphedema risk,   RED FLAGS: None   ACTIVITY LEVEL / LEISURE: threw  a football,   OBJECTIVE:   PATIENT SURVEYS:  QUICK DASH: 9.0 with 1 unanswered  OBSERVATIONS: Incisions well healed. Glue still present at breast incision  POSTURE:  Forward head, rounded shoulders  LYMPHEDEMA ASSESSMENT:  UPPER EXTREMITY AROM/PROM:   A/PROM RIGHT   eval    Shoulder extension 39 and painful  Shoulder flexion 146 and painful  Shoulder abduction 152  Shoulder internal rotation 82  Shoulder external rotation 82                          (Blank rows = not tested)   A/PROM LEFT   eval LEFT 12/10/2023  Shoulder extension 54 70  Shoulder flexion 148 168  Shoulder abduction 168 180  Shoulder internal rotation 69 70  Shoulder  external rotation 78 100                          (Blank rows = not tested)   CERVICAL AROM: All within normal limits   UPPER EXTREMITY STRENGTH: Left UE WNL; Right UE not tested due to recent injury   LYMPHEDEMA ASSESSMENTS (in cm):    LANDMARK RIGHT   eval  10 cm proximal to olecranon process 29.5  Olecranon process 26.6  10 cm proximal to ulnar styloid process 22  Just proximal to ulnar styloid process 17.1  Across hand at thumb web space 20.4  At base of 2nd digit 6.5  (Blank rows = not tested)   LANDMARK LEFT   eval LEFT 12/10/2023  10 cm proximal to olecranon process 29.8 28.4  Olecranon process 26.4 26.0  10 cm proximal to ulnar styloid process 21.2 21.1  Just proximal to ulnar styloid process 16.6 17.0  Across hand at thumb web space 20.5 20.5  At base of 2nd digit 6.5 6.5  (Blank rows = not tested)     Surgery type/Date: 11/05/2023 Left Lumpectomy with SLNB Number of lymph nodes removed: 0+/4 Current/past treatment (chemo, radiation, hormone therapy): Having Herceptin  infusions, and taking Tamoxifen . Declined radiation Other symptoms:  Heaviness/tightness Yes Pain Yes Pitting edema No Infections Yes Decreased scar mobility Yes Stemmer sign No  PATIENT EDUCATION:  Education details: scar massage, video, compression bra prn to hold in foam pad, foam pad made for fibrosis at breast incision and for axillary region of compression bra, discussed lymphedema briefly, importance of SOZO screens. Person educated: Patient Education method: Explanation, Demonstration, and Handouts Education comprehension: verbalized understanding and returned demonstration  HOME EXERCISE PROGRAM: Reviewed previously given post op HEP.   ASSESSMENT:  CLINICAL IMPRESSION:  Pt is s/p left lumpectomy with SLNB on 11/05/2023. Her ROM has returned to normal and there is no sign of lymphedema. Incisions are healed with  a small amount of fibrosis under each incision. A foam pad was made for  breast incision to try and soften and pt was instructed in scar massage. She was reminded to watch the lymphedema video. There are no further needs identified, however, she was advised to contact me with any questions or concerns. She is discharged.  Pt will benefit from skilled therapeutic intervention to improve on the following deficits: Decreased knowledge of precautions, impaired UE functional use, pain, decreased ROM, postural dysfunction.   PT treatment/interventions: ADL/Self care home management, [phone]- PT Re-evaluation, 97110-Therapeutic exercises, 97535- Self Care, and 02859- Manual therapy   GOALS: Goals reviewed with patient? Yes  LONG TERM GOALS:  (STG=LTG)  GOALS Name Target Date  Goal status  1 Pt will demonstrate she has regained full shoulder ROM and function post operatively compared to baselines.  Baseline: 12/10/2023 INITIAL     PLAN:  PT FREQUENCY/DURATION: No further needs identified  PLAN FOR NEXT SESSION: Pt is discharged PHYSICAL THERAPY DISCHARGE SUMMARY  Visits from Start of Care: 2  Current functional level related to goals / functional outcomes: Achieved goals   Remaining deficits: Mild fibrosis/scar tissue under both incisions   Education / Equipment: HEP   Patient agrees to discharge. Patient goals were met. Patient is being discharged due to meeting the stated rehab goals.   [facility]  [address]  [place] [address]  [phone]  After Breast Cancer Class Video It is recommended you view the ABC class video to be educated on lymphedema risk reduction. This video lasts for about 30 minutes. It can be viewed on our website here: [email or web address]  Scar massage You can begin gentle scar massage to you incision sites. Gently place one hand on the incision and move the skin (without sliding on the skin) in various directions. Do this  for a few minutes and then you can gently massage either coconut oil or vitamin E cream into the scars.  Compression garment You should continue wearing your compression bra until you feel like you no longer have swelling.  Home exercise Program Continue doing the exercises you were given until you feel like you can do them without feeling any tightness at the end.   Walking Program Studies show that 30 minutes of walking per day (fast enough to elevate your heart rate) can significantly reduce the risk of a cancer recurrence. If you can't walk due to other medical reasons, we encourage you to find another activity you could do (like a stationary bike or water exercise).  Posture After breast cancer surgery, people frequently sit with rounded shoulders posture because it puts their incisions on slack and feels better. If you sit like this and scar tissue forms in that position, you can become very tight and have pain sitting or standing with good posture. Try to be aware of your posture and sit and stand up tall to heal properly.  Follow up PT: It is recommended you return every 3 months for the first 3 years following surgery to be assessed on the SOZO machine for an L-Dex score. This helps prevent clinically significant lymphedema in 95% of patients. These follow up screens are 10 minute appointments that you are not billed for.  Grayce JINNY Sheldon, PT 12/10/2023, 3:46 PM

## 2023-12-10 ENCOUNTER — Ambulatory Visit: Attending: Surgery

## 2023-12-10 DIAGNOSIS — Z170 Estrogen receptor positive status [ER+]: Secondary | ICD-10-CM | POA: Diagnosis present

## 2023-12-10 DIAGNOSIS — C50412 Malignant neoplasm of upper-outer quadrant of left female breast: Secondary | ICD-10-CM | POA: Diagnosis present

## 2023-12-10 DIAGNOSIS — M25511 Pain in right shoulder: Secondary | ICD-10-CM | POA: Insufficient documentation

## 2023-12-10 DIAGNOSIS — R293 Abnormal posture: Secondary | ICD-10-CM | POA: Insufficient documentation

## 2023-12-10 NOTE — Patient Instructions (Signed)
 [facility]  [address], [address]  [place] [address]  [phone]  After Breast Cancer Class Video It is recommended you view the ABC class video to be educated on lymphedema risk reduction. This video lasts for about 30 minutes. It can be viewed on our website here: [email or web address]  Scar massage You can begin gentle scar massage to you incision sites. Gently place one hand on the incision and move the skin (without sliding on the skin) in various directions. Do this for a few minutes and then you can gently massage either coconut oil or vitamin E cream into the scars.  Compression garment You should continue wearing your compression bra until you feel like you no longer have swelling.  Home exercise Program Continue doing the exercises you were given until you feel like you can do them without feeling any tightness at the end.   Walking Program Studies show that 30 minutes of walking per day (fast enough to elevate your heart rate) can significantly reduce the risk of a cancer recurrence. If you can't walk due to other medical reasons, we encourage you to find another activity you could do (like a stationary bike or water exercise).  Posture After breast cancer surgery, people frequently sit with rounded shoulders posture because it puts their incisions on slack and feels better. If you sit like this and scar tissue forms in that position, you can become very tight and have pain sitting or standing with good posture. Try to be aware of your posture and sit and stand up tall to heal properly.  Follow up PT: It is recommended you return every 3 months for the first 3 years following surgery to be assessed on the SOZO machine for an L-Dex score. This helps prevent clinically significant lymphedema in 95% of patients. These follow up screens are 10 minute appointments that you are not billed  for.

## 2023-12-11 ENCOUNTER — Other Ambulatory Visit: Payer: Self-pay

## 2023-12-11 ENCOUNTER — Telehealth: Payer: Self-pay

## 2023-12-11 NOTE — Telephone Encounter (Signed)
 Returned patient's phone call regarding return to work form. Patient states that she has been out of work due to complications with surgery and pain medication that caused drowsiness. Patient states that she needs a letter to return to work. Advised Patient to talk with Surgeon regarding return to work due to complications related to surgery. Patient verbalized understanding. No other need sor concerns noted at this time.

## 2023-12-25 NOTE — Assessment & Plan Note (Signed)
 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 Neoadjuvant chemotherapy with TCH Perjeta 6 cycles followed by Herceptin  Perjeta maintenance versus Kadcyla maintenance (based on response to neoadjuvant chemo) for 1 year (patient decided to not proceed with chemo because of fear of losing employment), patient is starting tamoxifen  with Herceptin  injections on 10/11/2023 2. Followed by breast conserving surgery if possible with sentinel lymph node study 3. Followed by adjuvant radiation therapy if patient had lumpectomy Genetic testing ----------------------------------------------------------------------- New treatment plan: Herceptin  injections every [redacted] weeks along with tamoxifen  as neoadjuvant treatment.  (Started 10/14/2023) 11/05/2023 left lumpectomy: Grade 3 IDC 3 cm with high-grade DCIS, margins negative, 0/4 lymph nodes negative, ER 90%, PR 95%, HER2 3+ positive, Ki67 30%   Tamoxifen  toxicities: Tolerating tamoxifen  extremely well without any problems.    Return to clinic every 3 weeks for Herceptin .  Patient is not interested in doing chemotherapy and therefore we decided to treat her with Herceptin  and tamoxifen  instead.

## 2023-12-26 ENCOUNTER — Inpatient Hospital Stay: Attending: Hematology and Oncology | Admitting: Hematology and Oncology

## 2023-12-26 ENCOUNTER — Inpatient Hospital Stay

## 2023-12-26 ENCOUNTER — Other Ambulatory Visit: Payer: Self-pay | Admitting: *Deleted

## 2023-12-26 VITALS — BP 105/77 | HR 55

## 2023-12-26 VITALS — BP 100/58 | HR 61 | Temp 97.6°F | Resp 18 | Ht 76.0 in | Wt 203.3 lb

## 2023-12-26 DIAGNOSIS — N920 Excessive and frequent menstruation with regular cycle: Secondary | ICD-10-CM

## 2023-12-26 DIAGNOSIS — Z1731 Human epidermal growth factor receptor 2 positive status: Secondary | ICD-10-CM | POA: Diagnosis not present

## 2023-12-26 DIAGNOSIS — Z170 Estrogen receptor positive status [ER+]: Secondary | ICD-10-CM

## 2023-12-26 DIAGNOSIS — C50412 Malignant neoplasm of upper-outer quadrant of left female breast: Secondary | ICD-10-CM | POA: Diagnosis not present

## 2023-12-26 DIAGNOSIS — N922 Excessive menstruation at puberty: Secondary | ICD-10-CM

## 2023-12-26 DIAGNOSIS — Z1721 Progesterone receptor positive status: Secondary | ICD-10-CM | POA: Diagnosis not present

## 2023-12-26 DIAGNOSIS — Z5112 Encounter for antineoplastic immunotherapy: Secondary | ICD-10-CM | POA: Diagnosis present

## 2023-12-26 DIAGNOSIS — Z79810 Long term (current) use of selective estrogen receptor modulators (SERMs): Secondary | ICD-10-CM | POA: Diagnosis not present

## 2023-12-26 MED ORDER — SODIUM CHLORIDE 0.9% FLUSH
10.0000 mL | INTRAVENOUS | Status: DC | PRN
  Administered 2023-12-26: 10 mL

## 2023-12-26 MED ORDER — ACETAMINOPHEN 325 MG PO TABS
650.0000 mg | ORAL_TABLET | Freq: Once | ORAL | Status: AC
  Administered 2023-12-26: 650 mg via ORAL
  Filled 2023-12-26: qty 2

## 2023-12-26 MED ORDER — SODIUM CHLORIDE 0.9 % IV SOLN
INTRAVENOUS | Status: DC
Start: 2023-12-26 — End: 2023-12-26

## 2023-12-26 MED ORDER — DIPHENHYDRAMINE HCL 25 MG PO CAPS
25.0000 mg | ORAL_CAPSULE | Freq: Once | ORAL | Status: AC
  Administered 2023-12-26: 25 mg via ORAL
  Filled 2023-12-26: qty 1

## 2023-12-26 MED ORDER — TRASTUZUMAB-ANNS CHEMO 150 MG IV SOLR
6.0000 mg/kg | Freq: Once | INTRAVENOUS | Status: AC
  Administered 2023-12-26: 600 mg via INTRAVENOUS
  Filled 2023-12-26: qty 28.57

## 2023-12-26 NOTE — Patient Instructions (Signed)
 [facility] A DE[facility] OF [facility]  Discharge Instructions: Thank you for choosing [facility] to provide your oncology and hematology care.   If you have a lab appointment with the Cancer Center, please go directly to the Cancer Center and check in at the registration area.   Wear comfortable clothing and clothing appropriate for easy access to any Portacath or PICC line.   We strive to give you quality time with your provider. You may need to reschedule your appointment if you arrive late (15 or more minutes).  Arriving late affects you and other patients whose appointments are after yours.  Also, if you miss three or more appointments without notifying the office, you may be dismissed from the clinic at the provider's discretion.      For prescription refill requests, have your pharmacy contact our office and allow 72 hours for refills to be completed.    Today you received the following chemotherapy and/or immunotherapy agents: trastuzumab      To help prevent nausea and vomiting after your treatment, we encourage you to take your nausea medication as directed.  BELOW ARE SYMPTOMS THAT SHOULD BE REPORTED IMMEDIATELY: *FEVER GREATER THAN 100.4 F (38 C) OR HIGHER *CHILLS OR SWEATING *NAUSEA AND VOMITING THAT IS NOT CONTROLLED WITH YOUR NAUSEA MEDICATION *UNUSUAL SHORTNESS OF BREATH *UNUSUAL BRUISING OR BLEEDING *URINARY PROBLEMS (pain or burning when urinating, or frequent urination) *BOWEL PROBLEMS (unusual diarrhea, constipation, pain near the anus) TENDERNESS IN MOUTH AND THROAT WITH OR WITHOUT PRESENCE OF ULCERS (sore throat, sores in mouth, or a toothache) UNUSUAL RASH, SWELLING OR PAIN  UNUSUAL VAGINAL DISCHARGE OR ITCHING   Items with * indicate a potential emergency and should be followed up as soon as possible or go to the Emergency Department if any problems should occur.  Please show the CHEMOTHERAPY ALERT CARD or  IMMUNOTHERAPY ALERT CARD at check-in to the Emergency Department and triage nurse.  Should you have questions after your visit or need to cancel or reschedule your appointment, please contact [facility] - A DEPT [facility]  Dept: [phone]  and follow the prompts.  Office hours are 8:00 a.m. to 4:30 p.m. Monday - Friday. Please note that voicemails left after 4:00 p.m. may not be returned until the following business day.  We are closed weekends and major holidays. You have access to a nurse at all times for urgent questions. Please call the main number to the clinic Dept: [phone] and follow the prompts.   For any non-urgent questions, you may also contact your provider using MyChart. We now offer e-Visits for anyone 56 and older to request care online for non-urgent symptoms. For details visit [email or web address].   Also download the MyChart app! Go to the app store, search "MyChart", open the app, select [place], and log in with your MyChart username and password.

## 2023-12-26 NOTE — Progress Notes (Signed)
 Patient Care Team: Patient, No Pcp Per as PCP - General (General Practice) Glean Stephane BROCKS, RN (Inactive) as Oncology Nurse Navigator Tyree Nanetta SAILOR, RN as Oncology Nurse Navigator Vanderbilt Ned, MD as Consulting Physician (General Surgery) Odean Potts, MD as Consulting Physician (Hematology and Oncology) Dewey Rush, MD as Consulting Physician (Radiation Oncology)  DIAGNOSIS:  Encounter Diagnosis  Name Primary?   Malignant neoplasm of upper-outer quadrant of left breast in female, estrogen receptor positive (HCC) Yes    SUMMARY OF ONCOLOGIC HISTORY: Oncology History  Malignant neoplasm of upper-outer quadrant of left breast in female, estrogen receptor positive (HCC)  08/23/2023 Initial Diagnosis   Palpable left breast mass at 2 o'clock position measuring 3.1 cm, 1 axillary lymph node biopsy: Concordant, D density breast, biopsy: Grade 2 IDC ER 90%, PR 95%, Ki67 30%, HER2 3+ positive   09/04/2023 Cancer Staging   Staging form: Breast, AJCC 8th Edition - Clinical: Stage IB (cT2, cN0, cM0, G2, ER+, PR+, HER2+) - Signed by Odean Potts, MD on 09/04/2023 Stage prefix: Initial diagnosis Histologic grading system: 3 grade system   09/12/2023 Genetic Testing   Negative [facility] CancerNext-Expanded +RNAinsight Panel.  Report date is 09/13/2023.   The CancerNext-Expanded gene panel offered by [facility] and includes sequencing, rearrangement, and RNA analysis for the following 76 genes: AIP, ALK, APC, ATM, AXIN2, BAP1, BARD1, BMPR1A, BRCA1, BRCA2, BRIP1, CDC73, CDH1, CDK4, CDKN1B, CDKN2A, CEBPA, CHEK2, CTNNA1, DDX41, DICER1, ETV6, FH, FLCN, GATA2, LZTR1, MAX, MBD4, MEN1, MET, MLH1, MSH2, MSH3, MSH6, MUTYH, NF1, NF2, NTHL1, PALB2, PHOX2B, PMS2, POT1, PRKAR1A, PTCH1, PTEN, RAD51C, RAD51D, RB1, RET, RUNX1, SDHA, SDHAF2, SDHB, SDHC, SDHD, SMAD4, SMARCA4, SMARCB1, SMARCE1, STK11, SUFU, TMEM127, TP53, TSC1, TSC2, VHL, and WT1 (sequencing and deletion/duplication); EGFR, HOXB13, KIT, MITF, PDGFRA,  POLD1, and POLE (sequencing only); EPCAM and GREM1 (deletion/duplication only).    10/14/2023 -  Chemotherapy   Patient is on Treatment Plan : BREAST MAINTENANCE Trastuzumab  IV (6) or SQ (600) D1 q21d X 11 Cycles     11/05/2023 Surgery   Left lumpectomy: Grade 3 IDC 3 cm with high-grade DCIS, margins negative, 0/4 lymph nodes negative, ER 90%, PR 95%, HER2 3+ positive, Ki67 30%   11/28/2023 Cancer Staging   Staging form: Breast, AJCC 8th Edition - Pathologic: Stage IA (pT2, pN0, cM0, G3, ER+, PR+, HER2+) - Signed by Odean Potts, MD on 11/28/2023 Histologic grading system: 3 grade system     CHIEF COMPLIANT: Follow-up on Herceptin  and tamoxifen   HISTORY OF PRESENT ILLNESS:   History of Present Illness Donna [name] is a 33 year old with estrogen receptor positive breast cancer who presents for follow-up regarding Herceptin  infusion therapy.  She is tolerating the treatment extremely well.  She is also tolerating tamoxifen .  She is tolerating Herceptin  infusion therapy well without bowel issues. Menstrual changes include heavier bleeding with clotting initially, followed by intermittent flow. Emotional lability is noted, with increased frequency of crying. No other gynecological problems are present.     ALLERGIES:  has no known allergies.  MEDICATIONS:  Current Outpatient Medications  Medication Sig Dispense Refill   tamoxifen  (NOLVADEX ) 20 MG tablet Take 1 tablet (20 mg total) by mouth daily. 90 tablet 3   No current facility-administered medications for this visit.    PHYSICAL EXAMINATION: ECOG PERFORMANCE STATUS: 1 - Symptomatic but completely ambulatory  Vitals:   12/26/23 0910  BP: (!) 100/58  Pulse: 61  Resp: 18  Temp: 97.6 F (36.4 C)  SpO2: 99%   Filed Weights  12/26/23 0910  Weight: 203 lb 4.8 oz (92.2 kg)      LABORATORY DATA:  I have reviewed the data as listed    Latest Ref Rng & Units 11/10/2023    5:32 PM 10/14/2023    8:34 AM 09/04/2023    12:33 PM  CMP  Glucose 70 - 99 mg/dL 81  98  87   BUN 6 - 20 mg/dL 5  8  8    Creatinine 0.44 - 1.00 mg/dL 9.02  9.04  9.14   Sodium 135 - 145 mmol/L 140  141  139   Potassium 3.5 - 5.1 mmol/L 3.9  3.3  4.4   Chloride 98 - 111 mmol/L 108  111  106   CO2 22 - 32 mmol/L 23  25  30    Calcium 8.9 - 10.3 mg/dL 9.2  8.3  9.2   Total Protein 6.5 - 8.1 g/dL 6.9  6.3  7.2   Total Bilirubin 0.0 - 1.2 mg/dL 0.6  0.3  0.5   Alkaline Phos 38 - 126 U/L 32  45  47   AST 15 - 41 U/L 28  25  16    ALT 0 - 44 U/L 23  33  13     Lab Results  Component Value Date   WBC 6.0 11/10/2023   HGB 13.8 11/10/2023   HCT 41.8 11/10/2023   MCV 87.6 11/10/2023   PLT 178 11/10/2023   NEUTROABS 3.2 11/10/2023    ASSESSMENT & PLAN:  Malignant neoplasm of upper-outer quadrant of left breast in female, estrogen receptor positive (HCC) 08/23/2023:Palpable left breast mass at 2 o'clock position measuring 3.1 cm, 1 axillary lymph node biopsy: Concordant, D density breast, biopsy: Grade 2 IDC ER 90%, PR 95%, Ki67 30%, HER2 3+ positive    Recommendation  1. Neoadjuvant chemotherapy with TCH Perjeta 6 cycles followed by Herceptin  Perjeta maintenance versus Kadcyla maintenance (based on response to neoadjuvant chemo) for 1 year (patient decided to not proceed with chemo because of fear of losing employment), patient is starting tamoxifen  with Herceptin  injections on 10/11/2023 2. Followed by breast conserving surgery if possible with sentinel lymph node study 3. Followed by adjuvant radiation therapy if patient had lumpectomy Genetic testing ----------------------------------------------------------------------- New treatment plan: Herceptin  injections every [redacted] weeks along with tamoxifen  as neoadjuvant treatment.  (Started 10/14/2023) 11/05/2023 left lumpectomy: Grade 3 IDC 3 cm with high-grade DCIS, margins negative, 0/4 lymph nodes negative, ER 90%, PR 95%, HER2 3+ positive, Ki67 30%   Tamoxifen  toxicities: Menstrual cycle  changes: Patient is experiencing more heavier bleeding and clotting.  I would like to refer her to GYN for an evaluation.    Return to clinic every 3 weeks for Herceptin .  Patient is not interested in doing chemotherapy and therefore we decided to treat her with Herceptin  and tamoxifen  instead. ------------------------------------- Assessment and Plan Assessment & Plan Malignant neoplasm of upper-outer quadrant of left breast, estrogen receptor positive Undergoing treatment for estrogen receptor positive breast cancer. Tolerating regoxidine and infusion therapy well. - Continue current treatment regimen. - Schedule echocardiogram in August for routine monitoring.  Menstrual irregularities Reports changes in menstrual cycle with heavier bleeding and clotting. Requires further evaluation. - Refer to gynecologist for evaluation of menstrual irregularities.      No orders of the defined types were placed in this encounter.  The patient has a good understanding of the overall plan. she agrees with it. she will call with any problems that may develop before the next visit here.  Total time spent: 30 mins including face to face time and time spent for planning, charting and co-ordination of care   Naomi MARLA Chad, MD 12/26/23

## 2023-12-26 NOTE — Progress Notes (Signed)
 Per MD request, referral placed for Gynecology at [facility] for evaluation and treatment of heavy menstrual bleeding while on Tamoxifen . Orders placed and RN contacted office who verified they received referral.

## 2023-12-28 ENCOUNTER — Other Ambulatory Visit: Payer: Self-pay

## 2024-01-10 ENCOUNTER — Other Ambulatory Visit (HOSPITAL_COMMUNITY)

## 2024-01-15 ENCOUNTER — Ambulatory Visit (INDEPENDENT_AMBULATORY_CARE_PROVIDER_SITE_OTHER): Admitting: Certified Nurse Midwife

## 2024-01-15 ENCOUNTER — Other Ambulatory Visit (HOSPITAL_COMMUNITY)
Admission: RE | Admit: 2024-01-15 | Discharge: 2024-01-15 | Disposition: A | Discharge status: Home / Self Care | Source: Ambulatory Visit

## 2024-01-15 ENCOUNTER — Ambulatory Visit (HOSPITAL_BASED_OUTPATIENT_CLINIC_OR_DEPARTMENT_OTHER)
Admission: RE | Admit: 2024-01-15 | Discharge: 2024-01-15 | Disposition: A | Discharge status: Home / Self Care | Source: Ambulatory Visit | Attending: Certified Nurse Midwife | Admitting: Certified Nurse Midwife

## 2024-01-15 ENCOUNTER — Encounter (HOSPITAL_BASED_OUTPATIENT_CLINIC_OR_DEPARTMENT_OTHER): Payer: Self-pay | Admitting: Certified Nurse Midwife

## 2024-01-15 ENCOUNTER — Ambulatory Visit (HOSPITAL_COMMUNITY)
Admission: RE | Admit: 2024-01-15 | Discharge: 2024-01-15 | Disposition: A | Discharge status: Home / Self Care | Source: Ambulatory Visit | Attending: Hematology and Oncology | Admitting: Hematology and Oncology

## 2024-01-15 VITALS — BP 99/66 | HR 51 | Ht 76.0 in | Wt 203.4 lb

## 2024-01-15 DIAGNOSIS — Z170 Estrogen receptor positive status [ER+]: Secondary | ICD-10-CM

## 2024-01-15 DIAGNOSIS — Z0189 Encounter for other specified special examinations: Secondary | ICD-10-CM

## 2024-01-15 DIAGNOSIS — N852 Hypertrophy of uterus: Secondary | ICD-10-CM

## 2024-01-15 DIAGNOSIS — Z023 Encounter for examination for recruitment to armed forces: Secondary | ICD-10-CM | POA: Insufficient documentation

## 2024-01-15 DIAGNOSIS — N921 Excessive and frequent menstruation with irregular cycle: Secondary | ICD-10-CM

## 2024-01-15 DIAGNOSIS — N83292 Other ovarian cyst, left side: Secondary | ICD-10-CM | POA: Insufficient documentation

## 2024-01-15 DIAGNOSIS — C50412 Malignant neoplasm of upper-outer quadrant of left female breast: Secondary | ICD-10-CM | POA: Insufficient documentation

## 2024-01-15 DIAGNOSIS — K529 Noninfective gastroenteritis and colitis, unspecified: Secondary | ICD-10-CM | POA: Insufficient documentation

## 2024-01-15 DIAGNOSIS — M7020 Olecranon bursitis, unspecified elbow: Secondary | ICD-10-CM | POA: Insufficient documentation

## 2024-01-15 DIAGNOSIS — Z01411 Encounter for gynecological examination (general) (routine) with abnormal findings: Secondary | ICD-10-CM

## 2024-01-15 DIAGNOSIS — M79673 Pain in unspecified foot: Secondary | ICD-10-CM | POA: Insufficient documentation

## 2024-01-15 DIAGNOSIS — M722 Plantar fascial fibromatosis: Secondary | ICD-10-CM | POA: Insufficient documentation

## 2024-01-15 DIAGNOSIS — Z01818 Encounter for other preprocedural examination: Secondary | ICD-10-CM | POA: Diagnosis present

## 2024-01-15 DIAGNOSIS — F40298 Other specified phobia: Secondary | ICD-10-CM | POA: Insufficient documentation

## 2024-01-15 DIAGNOSIS — F17200 Nicotine dependence, unspecified, uncomplicated: Secondary | ICD-10-CM | POA: Insufficient documentation

## 2024-01-15 DIAGNOSIS — H5210 Myopia, unspecified eye: Secondary | ICD-10-CM | POA: Insufficient documentation

## 2024-01-15 DIAGNOSIS — Z113 Encounter for screening for infections with a predominantly sexual mode of transmission: Secondary | ICD-10-CM

## 2024-01-15 DIAGNOSIS — B359 Dermatophytosis, unspecified: Secondary | ICD-10-CM | POA: Insufficient documentation

## 2024-01-15 DIAGNOSIS — Z1151 Encounter for screening for human papillomavirus (HPV): Secondary | ICD-10-CM | POA: Diagnosis not present

## 2024-01-15 DIAGNOSIS — C50912 Malignant neoplasm of unspecified site of left female breast: Secondary | ICD-10-CM

## 2024-01-15 DIAGNOSIS — N83291 Other ovarian cyst, right side: Secondary | ICD-10-CM | POA: Insufficient documentation

## 2024-01-15 DIAGNOSIS — Z0110 Encounter for examination of ears and hearing without abnormal findings: Secondary | ICD-10-CM | POA: Insufficient documentation

## 2024-01-15 DIAGNOSIS — Z124 Encounter for screening for malignant neoplasm of cervix: Secondary | ICD-10-CM

## 2024-01-15 DIAGNOSIS — Z0000 Encounter for general adult medical examination without abnormal findings: Secondary | ICD-10-CM

## 2024-01-15 DIAGNOSIS — Z4789 Encounter for other orthopedic aftercare: Secondary | ICD-10-CM | POA: Insufficient documentation

## 2024-01-15 DIAGNOSIS — M25569 Pain in unspecified knee: Secondary | ICD-10-CM | POA: Insufficient documentation

## 2024-01-15 DIAGNOSIS — M25579 Pain in unspecified ankle and joints of unspecified foot: Secondary | ICD-10-CM | POA: Insufficient documentation

## 2024-01-15 LAB — ECHOCARDIOGRAM COMPLETE
AR max vel: 3.19 cm2
AV Area VTI: 2.99 cm2
AV Area mean vel: 3.26 cm2
AV Mean grad: 5 mmHg
AV Peak grad: 8.9 mmHg
Ao pk vel: 1.49 m/s
Area-P 1/2: 2.74 cm2
Height: 76 in
S' Lateral: 3.5 cm
Weight: 3254.4 [oz_av]

## 2024-01-15 MED ORDER — FLUOXETINE HCL 10 MG PO CAPS: 10.0000 mg | ORAL_CAPSULE | Freq: Every day | ORAL | 0 refills | Status: DC

## 2024-01-15 NOTE — Progress Notes (Unsigned)
  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 She is taking Tamoxifen daily (10 years).    Pt concerned she may have PCOS due to significant hirsutism on face (labs).  She would like ot potentially have a baby in the future.

## 2024-01-15 NOTE — Progress Notes (Signed)
*  PRELIMINARY RESULTS* Echocardiogram 2D Echocardiogram has been performed.  Donna [name] Stallion 01/15/2024, 12:24 PM

## 2024-01-16 ENCOUNTER — Inpatient Hospital Stay: Admitting: Hematology and Oncology

## 2024-01-16 ENCOUNTER — Inpatient Hospital Stay: Attending: Hematology and Oncology

## 2024-01-16 ENCOUNTER — Encounter: Payer: Self-pay | Admitting: *Deleted

## 2024-01-16 VITALS — BP 105/71 | HR 70 | Temp 98.3°F | Resp 18 | Ht 76.0 in | Wt 207.8 lb

## 2024-01-16 DIAGNOSIS — Z1731 Human epidermal growth factor receptor 2 positive status: Secondary | ICD-10-CM | POA: Insufficient documentation

## 2024-01-16 DIAGNOSIS — Z1721 Progesterone receptor positive status: Secondary | ICD-10-CM | POA: Diagnosis not present

## 2024-01-16 DIAGNOSIS — Z5112 Encounter for antineoplastic immunotherapy: Secondary | ICD-10-CM | POA: Diagnosis present

## 2024-01-16 DIAGNOSIS — Z79810 Long term (current) use of selective estrogen receptor modulators (SERMs): Secondary | ICD-10-CM | POA: Diagnosis not present

## 2024-01-16 DIAGNOSIS — Z170 Estrogen receptor positive status [ER+]: Secondary | ICD-10-CM | POA: Insufficient documentation

## 2024-01-16 DIAGNOSIS — C50412 Malignant neoplasm of upper-outer quadrant of left female breast: Secondary | ICD-10-CM | POA: Insufficient documentation

## 2024-01-16 LAB — CYTOLOGY - PAP
Chlamydia: NEGATIVE
Comment: NEGATIVE
Comment: NEGATIVE
Comment: NEGATIVE
Comment: NORMAL
Diagnosis: NEGATIVE
High risk HPV: NEGATIVE
Neisseria Gonorrhea: NEGATIVE
Trichomonas: NEGATIVE

## 2024-01-16 LAB — RPR, QUANT+TP ABS (REFLEX)
Rapid Plasma Reagin, Quant: 1:1 {titer} — ABNORMAL HIGH
T Pallidum Abs: NONREACTIVE

## 2024-01-16 LAB — HIV ANTIBODY (ROUTINE TESTING W REFLEX): HIV Screen 4th Generation wRfx: NONREACTIVE

## 2024-01-16 LAB — HEPATITIS B SURFACE ANTIGEN: Hepatitis B Surface Ag: NEGATIVE

## 2024-01-16 LAB — HEPATITIS C ANTIBODY: Hep C Virus Ab: NONREACTIVE

## 2024-01-16 LAB — RPR: RPR Ser Ql: REACTIVE — AB

## 2024-01-16 MED ORDER — DIPHENHYDRAMINE HCL 25 MG PO CAPS
25.0000 mg | ORAL_CAPSULE | Freq: Once | ORAL | Status: AC
  Administered 2024-01-16: 25 mg via ORAL
  Filled 2024-01-16: qty 1

## 2024-01-16 MED ORDER — ACETAMINOPHEN 325 MG PO TABS
650.0000 mg | ORAL_TABLET | Freq: Once | ORAL | Status: AC
  Administered 2024-01-16: 650 mg via ORAL
  Filled 2024-01-16: qty 2

## 2024-01-16 MED ORDER — SODIUM CHLORIDE 0.9% FLUSH: 10.0000 mL | INTRAVENOUS | Status: DC | PRN

## 2024-01-16 MED ORDER — SODIUM CHLORIDE 0.9 % IV SOLN: INTRAVENOUS | Status: DC

## 2024-01-16 MED ORDER — TRASTUZUMAB-ANNS CHEMO 150 MG IV SOLR
6.0000 mg/kg | Freq: Once | INTRAVENOUS | Status: AC
  Administered 2024-01-16: 600 mg via INTRAVENOUS
  Filled 2024-01-16: qty 28.57

## 2024-01-16 NOTE — Patient Instructions (Signed)
 [facility] - A DEPT OF [facility]  Discharge Instructions: Thank you for choosing [facility] to provide your oncology and hematology care.   If you have a lab appointment with the Cancer Center, please go directly to the Cancer Center and check in at the registration area.   Wear comfortable clothing and clothing appropriate for easy access to any Portacath or PICC line.   We strive to give you quality time with your provider. You may need to reschedule your appointment if you arrive late (15 or more minutes).  Arriving late affects you and other patients whose appointments are after yours.  Also, if you miss three or more appointments without notifying the office, you may be dismissed from the clinic at the provider's discretion.      For prescription refill requests, have your pharmacy contact our office and allow 72 hours for refills to be completed.    Today you received the following chemotherapy and/or immunotherapy agent: Trastuzumab  (Kanjinti )   To help prevent nausea and vomiting after your treatment, we encourage you to take your nausea medication as directed.  BELOW ARE SYMPTOMS THAT SHOULD BE REPORTED IMMEDIATELY: *FEVER GREATER THAN 100.4 F (38 C) OR HIGHER *CHILLS OR SWEATING *NAUSEA AND VOMITING THAT IS NOT CONTROLLED WITH YOUR NAUSEA MEDICATION *UNUSUAL SHORTNESS OF BREATH *UNUSUAL BRUISING OR BLEEDING *URINARY PROBLEMS (pain or burning when urinating, or frequent urination) *BOWEL PROBLEMS (unusual diarrhea, constipation, pain near the anus) TENDERNESS IN MOUTH AND THROAT WITH OR WITHOUT PRESENCE OF ULCERS (sore throat, sores in mouth, or a toothache) UNUSUAL RASH, SWELLING OR PAIN  UNUSUAL VAGINAL DISCHARGE OR ITCHING   Items with * indicate a potential emergency and should be followed up as soon as possible or go to the Emergency Department if any problems should occur.  Please show the CHEMOTHERAPY ALERT CARD or  IMMUNOTHERAPY ALERT CARD at check-in to the Emergency Department and triage nurse.  Should you have questions after your visit or need to cancel or reschedule your appointment, please contact [facility]  Dept: [phone]  and follow the prompts.  Office hours are 8:00 a.m. to 4:30 p.m. Monday - Friday. Please note that voicemails left after 4:00 p.m. may not be returned until the following business day.  We are closed weekends and major holidays. You have access to a nurse at all times for urgent questions. Please call the main number to the clinic Dept: [phone] and follow the prompts.   For any non-urgent questions, you may also contact your provider using MyChart. We now offer e-Visits for anyone 74 and older to request care online for non-urgent symptoms. For details visit [email or web address].   Also download the MyChart app! Go to the app store, search MyChart, open the app, select [place], and log in with your MyChart username and password.

## 2024-01-19 LAB — TESTT+TESTF+SHBG
Sex Hormone Binding: 117 nmol/L (ref 24.6–122.0)
Testosterone, Free: 1.8 pg/mL (ref 0.0–4.2)
Testosterone, Total, LC/MS: 59 ng/dL — ABNORMAL HIGH (ref 10.0–55.0)

## 2024-01-19 LAB — TSH: TSH: 2.11 u[IU]/mL (ref 0.450–4.500)

## 2024-01-19 LAB — HEMOGLOBIN A1C
Est. average glucose Bld gHb Est-mCnc: 103 mg/dL
Hgb A1c MFr Bld: 5.2 % (ref 4.8–5.6)

## 2024-01-19 LAB — VITAMIN B12: Vitamin B-12: 558 pg/mL (ref 232–1245)

## 2024-01-19 LAB — DHEA-SULFATE: DHEA-SO4: 190 ug/dL (ref 84.8–378.0)

## 2024-02-05 NOTE — Assessment & Plan Note (Signed)
 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 Neoadjuvant chemotherapy with TCH Perjeta 6 cycles followed by Herceptin  Perjeta maintenance versus Kadcyla maintenance (based on response to neoadjuvant chemo) for 1 year (patient decided to not proceed with chemo because of fear of losing employment), patient is starting tamoxifen  with Herceptin  injections on 10/11/2023 2. Followed by breast conserving surgery if possible with sentinel lymph node study 3. Followed by adjuvant radiation therapy if patient had lumpectomy Genetic testing ----------------------------------------------------------------------- New treatment plan: Herceptin  injections every [redacted] weeks along with tamoxifen  as neoadjuvant treatment.  (Started 10/14/2023) 11/05/2023 left lumpectomy: Grade 3 IDC 3 cm with high-grade DCIS, margins negative, 0/4 lymph nodes negative, ER 90%, PR 95%, HER2 3+ positive, Ki67 30%   Tamoxifen  toxicities: Menstrual cycle changes: Patient is experiencing more heavier bleeding and clotting.  I would like to refer her to GYN for an evaluation.    Return to clinic every 3 weeks for Herceptin .  Patient is not interested in doing chemotherapy and therefore we decided to treat her with Herceptin  and tamoxifen  instead.

## 2024-02-06 ENCOUNTER — Inpatient Hospital Stay

## 2024-02-06 ENCOUNTER — Ambulatory Visit (HOSPITAL_BASED_OUTPATIENT_CLINIC_OR_DEPARTMENT_OTHER): Admitting: Family Medicine

## 2024-02-06 ENCOUNTER — Encounter (HOSPITAL_BASED_OUTPATIENT_CLINIC_OR_DEPARTMENT_OTHER): Payer: Self-pay | Admitting: Family Medicine

## 2024-02-06 ENCOUNTER — Inpatient Hospital Stay: Attending: Hematology and Oncology | Admitting: Hematology and Oncology

## 2024-02-06 VITALS — BP 120/66 | HR 62 | Ht 76.0 in | Wt 209.6 lb

## 2024-02-06 VITALS — BP 118/68 | HR 69 | Temp 98.6°F | Resp 18 | Ht 76.0 in | Wt 227.8 lb

## 2024-02-06 DIAGNOSIS — C50412 Malignant neoplasm of upper-outer quadrant of left female breast: Secondary | ICD-10-CM | POA: Insufficient documentation

## 2024-02-06 DIAGNOSIS — Z170 Estrogen receptor positive status [ER+]: Secondary | ICD-10-CM | POA: Diagnosis not present

## 2024-02-06 DIAGNOSIS — Z1721 Progesterone receptor positive status: Secondary | ICD-10-CM | POA: Insufficient documentation

## 2024-02-06 DIAGNOSIS — Z79810 Long term (current) use of selective estrogen receptor modulators (SERMs): Secondary | ICD-10-CM | POA: Diagnosis not present

## 2024-02-06 DIAGNOSIS — Z7689 Persons encountering health services in other specified circumstances: Secondary | ICD-10-CM | POA: Diagnosis not present

## 2024-02-06 DIAGNOSIS — Z1731 Human epidermal growth factor receptor 2 positive status: Secondary | ICD-10-CM | POA: Diagnosis not present

## 2024-02-06 DIAGNOSIS — Z5112 Encounter for antineoplastic immunotherapy: Secondary | ICD-10-CM | POA: Diagnosis present

## 2024-02-06 DIAGNOSIS — Z79899 Other long term (current) drug therapy: Secondary | ICD-10-CM | POA: Insufficient documentation

## 2024-02-06 DIAGNOSIS — Z13220 Encounter for screening for lipoid disorders: Secondary | ICD-10-CM | POA: Diagnosis not present

## 2024-02-06 MED ORDER — DIPHENHYDRAMINE HCL 25 MG PO CAPS
25.0000 mg | ORAL_CAPSULE | Freq: Once | ORAL | Status: AC
  Administered 2024-02-06: 25 mg via ORAL
  Filled 2024-02-06: qty 1

## 2024-02-06 MED ORDER — TRASTUZUMAB-ANNS CHEMO 150 MG IV SOLR
6.0000 mg/kg | Freq: Once | INTRAVENOUS | Status: AC
  Administered 2024-02-06: 600 mg via INTRAVENOUS
  Filled 2024-02-06: qty 28.57

## 2024-02-06 MED ORDER — ACETAMINOPHEN 325 MG PO TABS
650.0000 mg | ORAL_TABLET | Freq: Once | ORAL | Status: AC
  Administered 2024-02-06: 650 mg via ORAL
  Filled 2024-02-06: qty 2

## 2024-02-06 MED ORDER — SODIUM CHLORIDE 0.9 % IV SOLN: INTRAVENOUS | Status: DC

## 2024-02-06 NOTE — Progress Notes (Signed)
 New Patient Office Visit  Subjective:   Donna [name] 1990-07-04 02/06/2024  Chief Complaint  Patient presents with   New Patient (Initial Visit)    Patient is here today to get established with the practice. Denies any main concerns for today's visit.       HPI: Donna [name] presents today to establish care at [facility]. Introduced to Publishing rights manager role and practice setting.  All questions answered.   Last PCP: None Last annual physical: None; patient referred by Oncology to establish PCP Concerns: See below    Upon PCP entering room, patient is sound asleep and had to be awoken by PCP. She states she just completed treatment at cancer center and is very fatigued. She denies concerns at this time and wanted to establish care with PCP. She completed her annual exam with OBGYN in August 2025.    LEFT BREAST MALIGNANCY:  Patient is currently undergoing oncology care (Dr Odean oncologist) for malignancy of left breast. She is currently taking tamoxifen  and receiving herceptin  infusions every three weeks currently. Patient states she found palpable mass of left breast in March 2025. Stage IA per chart review as of June 2025. Donna [name] She is s/p left lumpectomy in June 2025. She has established with OBGYN due to heavier menstrual bleeding and clotting with starting Tamoxifen . She denies concerns with PCP at this time. Labs reviewed with patient during visit.     The following portions of the patient's history were reviewed and updated as appropriate: past medical history, past surgical history, family history, social history, allergies, medications, and problem list.   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/15/2024   Genetic testing 09/13/2023   Malignant neoplasm of upper-outer quadrant of left breast in female, estrogen receptor positive (HCC) 09/02/2023   Fracture of mandible (HCC) 01/03/2022   Hypocalcemia 01/03/2022   Laceration of mouth 01/03/2022   Hyperglycemia 01/03/2022   Osteoarthritis of knee 01/03/2022   Pain in right knee 01/03/2022   Tear of lateral meniscus of knee 01/03/2022   Tobacco user 01/03/2022   Acute lateral meniscus tear of right knee 12/15/2019   Past Medical History:  Diagnosis Date   Breast cancer (HCC) 08/23/2023   Past Surgical History:  Procedure Laterality Date   BREAST BIOPSY Left 08/23/2023   US  LT BREAST BX W LOC DEV 1ST LESION IMG BX SPEC US  GUIDE 08/23/2023 [facility]MO[facility]APHY   BREAST BIOPSY Left 11/01/2023   US  LT RADIOACTIVE SEED LOC 11/01/2023 [facility] MAMMOGRAPHY   BREAST LUMPECTOMY WITH RADIOACTIVE SEED AND SENTINEL LYMPH NODE BIOPSY Left 11/05/2023   Procedure: BREAST LUMPECTOMY WITH RADIOACTIVE SEED;  Surgeon: Vanderbilt Ned, MD;  Location: [facility] OR;  Service: General;  Laterality: Left;  LEFT BREAST SEED LUMPECTOMY, LEFT SENTINEL LYMPH NODE MAPPING   SENTINEL NODE BIOPSY Left 11/05/2023   Procedure: BIOPSY, LYMPH NODE, SENTINEL;  Surgeon: Vanderbilt Ned, MD;  Location: [facility] OR;  Service: General;  Laterality: Left;   Family History  Problem Relation Age of Onset   Breast  cancer Maternal Grandmother        contralateral; first dx <50; second dx in 12s   Cancer - Other Half-Sister 70       sarcoma; d. 69   Social History   Socioeconomic History   Marital status: Single    Spouse name: Not on file   Number of children: Not on file   Years of education: Not on file   Highest education level: Not on file   Occupational History   Not on file  Tobacco Use   Smoking status: Every Day    Current packs/day: 0.50    Average packs/day: 0.5 packs/day for 9.0 years (4.5 ttl pk-yrs)    Types: Cigars, Cigarettes   Smokeless tobacco: Never  Vaping Use   Vaping status: Former  Substance and Sexual Activity   Alcohol use: Yes    Alcohol/week: 0.0 standard drinks of alcohol    Comment: weekends   Drug use: No   Sexual activity: Not Currently    Birth control/protection: None  Other Topics Concern   Not on file  Social History Narrative   Not on file   Social Drivers of Health   Financial Resource Strain: Not on file  Food Insecurity: Food Insecurity Present (09/04/2023)   Hunger Vital Sign    Worried About Running Out of Food in the Last Year: Sometimes true    Ran Out of Food in the Last Year: Sometimes true  Transportation Needs: No Transportation Needs (09/04/2023)   PRAPARE - Administrator, Civil Service (Medical): No    Lack of Transportation (Non-Medical): No  Physical Activity: Not on file  Stress: Not on file  Social Connections: Not on file  Intimate Partner Violence: Not At Risk (09/04/2023)   Humiliation, Afraid, Rape, and Kick questionnaire    Fear of Current or Ex-Partner: No    Emotionally Abused: No    Physically Abused: No    Sexually Abused: No   Outpatient Medications Prior to Visit  Medication Sig Dispense Refill   tamoxifen  (NOLVADEX ) 20 MG tablet Take 1 tablet (20 mg total) by mouth daily. 90 tablet 3   FLUoxetine  (PROZAC ) 10 MG capsule Take 1 capsule (10 mg total) by mouth daily. (Patient not taking: Reported on 02/06/2024) 30 capsule 0   No facility-administered medications prior to visit.   No Known Allergies  ROS: A complete ROS was performed with pertinent positives/negatives noted in the HPI. The remainder of the ROS are negative.   Objective:   Today's Vitals   02/06/24 1524  BP: 120/66  Pulse: 62  SpO2: 100%  Weight: 209 lb 9.6 oz (95.1  kg)  Height: 6' 4 (1.93 m)    GENERAL: Well-appearing, in NAD. Well nourished.  SKIN: Pink, warm and dry. Head: Normocephalic. NECK: Trachea midline. Full ROM w/o pain or tenderness. RESPIRATORY: Chest wall symmetrical. Respirations even and non-labored. Breath sounds clear to auscultation bilaterally.  CARDIAC: S1, S2 present, regular rate and rhythm without murmur or gallops. Peripheral pulses 2+ bilaterally.  MSK: Muscle tone and strength appropriate for age.  NEUROLOGIC: No motor or sensory deficits. Steady, even gait. C2-C12 intact.  PSYCH/MENTAL STATUS: Alert, oriented x 3. Cooperative, appropriate mood and affect.      Assessment & Plan:  1. Encounter to establish care with new doctor (Primary) Reviewed patient's recent visits with oncology and OBGYN with patient. Discussed role of PCP and patient verbalized understanding. She has completed AE with OBGYN and this visit with labs reviewed by  PCP.   2. Screening for lipid disorders Will obtain fasting LP as part of annual screenings.  - Lipid panel; Future  3. Malignant neoplasm of upper-outer quadrant of left breast in female, estrogen receptor positive (HCC) Reviewed patient's recent labs and ongoing treatment plan. Continue with Oncology care.    Patient to reach out to office if new, worrisome, or unresolved symptoms arise or if no improvement in patient's condition. Patient verbalized understanding and is agreeable to treatment plan. All questions answered to patient's satisfaction.    Return in about 1 year (around 02/05/2025) for ANNUAL PHYSICAL.    Thersia Schuyler Stark, [place]

## 2024-02-06 NOTE — Patient Instructions (Signed)

## 2024-02-06 NOTE — Patient Instructions (Signed)
 Please return for fasting blood work.  For fasting, if your blood work is in the morning please do not eat any food after midnight.  You may have water or black coffee prior to your lab work.  Please take all regularly prescribed medications even if you are fasting.  If your blood work is in the afternoon, please fast for at least 5 to 6 hours.  You may continue to drink water and/or black coffee prior to your lab work.  Please take all scheduled medications even if you are fasting.

## 2024-02-06 NOTE — Progress Notes (Signed)
 Patient Care Team: Patient, No Pcp Per as PCP - General (General Practice) Glean Stephane BROCKS, RN (Inactive) as Oncology Nurse Navigator Tyree Nanetta SAILOR, RN as Oncology Nurse Navigator Vanderbilt Ned, MD as Consulting Physician (General Surgery) Odean Potts, MD as Consulting Physician (Hematology and Oncology) Dewey Rush, MD as Consulting Physician (Radiation Oncology)  DIAGNOSIS:  Encounter Diagnosis  Name Primary?   Malignant neoplasm of upper-outer quadrant of left breast in female, estrogen receptor positive (HCC) Yes    SUMMARY OF ONCOLOGIC HISTORY: Oncology History  Malignant neoplasm of upper-outer quadrant of left breast in female, estrogen receptor positive (HCC)  08/23/2023 Initial Diagnosis   Palpable left breast mass at 2 o'clock position measuring 3.1 cm, 1 axillary lymph node biopsy: Concordant, D density breast, biopsy: Grade 2 IDC ER 90%, PR 95%, Ki67 30%, HER2 3+ positive   09/04/2023 Cancer Staging   Staging form: Breast, AJCC 8th Edition - Clinical: Stage IB (cT2, cN0, cM0, G2, ER+, PR+, HER2+) - Signed by Odean Potts, MD on 09/04/2023 Stage prefix: Initial diagnosis Histologic grading system: 3 grade system   09/12/2023 Genetic Testing   Negative [facility] CancerNext-Expanded +RNAinsight Panel.  Report date is 09/13/2023.   The CancerNext-Expanded gene panel offered by [facility] and includes sequencing, rearrangement, and RNA analysis for the following 76 genes: AIP, ALK, APC, ATM, AXIN2, BAP1, BARD1, BMPR1A, BRCA1, BRCA2, BRIP1, CDC73, CDH1, CDK4, CDKN1B, CDKN2A, CEBPA, CHEK2, CTNNA1, DDX41, DICER1, ETV6, FH, FLCN, GATA2, LZTR1, MAX, MBD4, MEN1, MET, MLH1, MSH2, MSH3, MSH6, MUTYH, NF1, NF2, NTHL1, PALB2, PHOX2B, PMS2, POT1, PRKAR1A, PTCH1, PTEN, RAD51C, RAD51D, RB1, RET, RUNX1, SDHA, SDHAF2, SDHB, SDHC, SDHD, SMAD4, SMARCA4, SMARCB1, SMARCE1, STK11, SUFU, TMEM127, TP53, TSC1, TSC2, VHL, and WT1 (sequencing and deletion/duplication); EGFR, HOXB13, KIT, MITF, PDGFRA,  POLD1, and POLE (sequencing only); EPCAM and GREM1 (deletion/duplication only).    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  She is undergoing Herceptin  treatment with five sessions remaining and is considering reducing the frequency from every three weeks to once a month due to financial and personal life impacts. She manages the treatment independently and finds the current schedule challenging.  She has encountered issues with massage therapy access due to her treatment history, requiring a letter for service despite not undergoing chemotherapy or radiation. She has prepaid for sessions but cannot utilize them without documentation.  She is dealing with insurance-related issues, as her condition is considered pre-existing, requiring proof for coverage. She is also managing delayed paperwork related to disability claims. No new issues or side effects with Herceptin . No dialysis.     ALLERGIES:  has no known allergies.  MEDICATIONS:  Current Outpatient Medications  Medication Sig Dispense Refill   FLUoxetine  (PROZAC ) 10 MG capsule Take 1 capsule (10 mg total) by  mouth daily. 30 capsule 0   tamoxifen  (NOLVADEX ) 20 MG tablet Take 1 tablet (20 mg total) by mouth daily. 90 tablet 3   No current  facility-administered medications for this visit.   Facility-Administered Medications Ordered in Other Visits  Medication Dose Route Frequency Provider Last Rate Last Admin   0.9 %  sodium chloride  infusion   Intravenous Continuous Odean Potts, MD 10 mL/hr at 02/06/24 1223 New Bag at 02/06/24 1223    PHYSICAL EXAMINATION: ECOG PERFORMANCE STATUS: 1 - Symptomatic but completely ambulatory  Vitals:   02/06/24 1136  BP: 118/68  Pulse: 69  Resp: 18  Temp: 98.6 F (37 C)  SpO2: 99%   Filed Weights   02/06/24 1136  Weight: 227 lb 12.8 oz (103.3 kg)    Physical Exam   (exam performed in the presence of a chaperone)  LABORATORY DATA:  I have reviewed the data as listed    Latest Ref Rng & Units 11/10/2023    5:32 PM 10/14/2023    8:34 AM 09/04/2023   12:33 PM  CMP  Glucose 70 - 99 mg/dL 81  98  87   BUN 6 - 20 mg/dL 5  8  8    Creatinine 0.44 - 1.00 mg/dL 9.02  9.04  9.14   Sodium 135 - 145 mmol/L 140  141  139   Potassium 3.5 - 5.1 mmol/L 3.9  3.3  4.4   Chloride 98 - 111 mmol/L 108  111  106   CO2 22 - 32 mmol/L 23  25  30    Calcium 8.9 - 10.3 mg/dL 9.2  8.3  9.2   Total Protein 6.5 - 8.1 g/dL 6.9  6.3  7.2   Total Bilirubin 0.0 - 1.2 mg/dL 0.6  0.3  0.5   Alkaline Phos 38 - 126 U/L 32  45  47   AST 15 - 41 U/L 28  25  16    ALT 0 - 44 U/L 23  33  13     Lab Results  Component Value Date   WBC 6.0 11/10/2023   HGB 13.8 11/10/2023   HCT 41.8 11/10/2023   MCV 87.6 11/10/2023   PLT 178 11/10/2023   NEUTROABS 3.2 11/10/2023    ASSESSMENT & PLAN:  Malignant neoplasm of upper-outer quadrant of left breast in female, estrogen receptor positive (HCC) 08/23/2023:Palpable left breast mass at 2 o'clock position measuring 3.1 cm, 1 axillary lymph node biopsy: Concordant, D density breast, biopsy: Grade 2 IDC ER 90%, PR 95%, Ki67 30%, HER2 3+ positive    Recommendation  1. Neoadjuvant chemotherapy with TCH Perjeta 6 cycles followed by Herceptin  Perjeta maintenance versus  Kadcyla maintenance (based on response to neoadjuvant chemo) for 1 year (patient decided to not proceed with chemo because of fear of losing employment), patient is starting tamoxifen  with Herceptin  injections on 10/11/2023 2. Followed by breast conserving surgery if possible with sentinel lymph node study 3. Followed by adjuvant radiation therapy if patient had lumpectomy Genetic testing ----------------------------------------------------------------------- New treatment plan: Herceptin  injections every [redacted] weeks along with tamoxifen  as neoadjuvant treatment.  (Started 10/14/2023) 11/05/2023 left lumpectomy: Grade 3 IDC 3 cm with high-grade DCIS, margins negative, 0/4 lymph nodes negative, ER 90%, PR 95%, HER2 3+ positive, Ki67 30%   Tamoxifen  toxicities: Menstrual cycle changes: Patient is experiencing more heavier bleeding and clotting.  I would like to refer her to GYN for an evaluation.    Return to clinic every 3 weeks for Herceptin .  Patient is not  interested in doing chemotherapy and therefore we decided to treat her with Herceptin  and tamoxifen  instead.     No orders of the defined types were placed in this encounter.  The patient has a good understanding of the overall plan. she agrees with it. she will call with any problems that may develop before the next visit here. Total time spent: 30 mins including face to face time and time spent for planning, charting and co-ordination of care   Viinay K Athalie Newhard, MD 02/06/24

## 2024-02-07 ENCOUNTER — Other Ambulatory Visit: Payer: Self-pay

## 2024-02-10 ENCOUNTER — Other Ambulatory Visit (HOSPITAL_BASED_OUTPATIENT_CLINIC_OR_DEPARTMENT_OTHER): Payer: Self-pay | Admitting: Certified Nurse Midwife

## 2024-02-10 ENCOUNTER — Ambulatory Visit (HOSPITAL_BASED_OUTPATIENT_CLINIC_OR_DEPARTMENT_OTHER): Payer: Self-pay | Admitting: Certified Nurse Midwife

## 2024-02-10 DIAGNOSIS — N83299 Other ovarian cyst, unspecified side: Secondary | ICD-10-CM

## 2024-02-12 ENCOUNTER — Ambulatory Visit: Attending: Surgery

## 2024-02-12 DIAGNOSIS — Z170 Estrogen receptor positive status [ER+]: Secondary | ICD-10-CM | POA: Insufficient documentation

## 2024-02-12 DIAGNOSIS — C50412 Malignant neoplasm of upper-outer quadrant of left female breast: Secondary | ICD-10-CM | POA: Insufficient documentation

## 2024-02-12 DIAGNOSIS — M25511 Pain in right shoulder: Secondary | ICD-10-CM | POA: Insufficient documentation

## 2024-02-12 DIAGNOSIS — R293 Abnormal posture: Secondary | ICD-10-CM | POA: Insufficient documentation

## 2024-02-13 ENCOUNTER — Telehealth: Payer: Self-pay

## 2024-02-13 NOTE — Telephone Encounter (Signed)
 Pt called checking on the status of her FMLA form.

## 2024-02-27 ENCOUNTER — Inpatient Hospital Stay

## 2024-02-27 ENCOUNTER — Encounter: Payer: Self-pay | Admitting: *Deleted

## 2024-03-05 ENCOUNTER — Ambulatory Visit

## 2024-03-05 ENCOUNTER — Telehealth: Payer: Self-pay

## 2024-03-05 NOTE — Telephone Encounter (Signed)
 Notified the pt regarding her FMLA form being completed,faxed,and confirmation received. Pt  Copy was emailed to her upon request. No questions or concerns to be noted at this time.

## 2024-03-11 ENCOUNTER — Other Ambulatory Visit: Payer: Self-pay | Admitting: Hematology and Oncology

## 2024-03-13 ENCOUNTER — Encounter: Payer: Self-pay | Admitting: Hematology and Oncology

## 2024-03-13 NOTE — Progress Notes (Signed)
 Per Dr. Gudena, no Trastuzumab  reload necessary.  Jahad Old, Pharm.D., CPP 03/13/2024@8 :04 AM

## 2024-03-17 ENCOUNTER — Ambulatory Visit

## 2024-03-17 ENCOUNTER — Inpatient Hospital Stay: Attending: Hematology and Oncology

## 2024-03-17 VITALS — BP 113/76 | HR 63 | Temp 98.6°F | Resp 18 | Ht 76.0 in | Wt 207.5 lb

## 2024-03-17 DIAGNOSIS — Z5112 Encounter for antineoplastic immunotherapy: Secondary | ICD-10-CM | POA: Insufficient documentation

## 2024-03-17 DIAGNOSIS — C50412 Malignant neoplasm of upper-outer quadrant of left female breast: Secondary | ICD-10-CM | POA: Diagnosis present

## 2024-03-17 DIAGNOSIS — Z1731 Human epidermal growth factor receptor 2 positive status: Secondary | ICD-10-CM | POA: Diagnosis not present

## 2024-03-17 DIAGNOSIS — Z1721 Progesterone receptor positive status: Secondary | ICD-10-CM | POA: Diagnosis not present

## 2024-03-17 DIAGNOSIS — Z170 Estrogen receptor positive status [ER+]: Secondary | ICD-10-CM | POA: Diagnosis not present

## 2024-03-17 MED ORDER — DIPHENHYDRAMINE HCL 25 MG PO CAPS
25.0000 mg | ORAL_CAPSULE | Freq: Once | ORAL | Status: AC
  Administered 2024-03-17: 25 mg via ORAL
  Filled 2024-03-17: qty 1

## 2024-03-17 MED ORDER — SODIUM CHLORIDE 0.9 % IV SOLN: INTRAVENOUS | Status: DC

## 2024-03-17 MED ORDER — ACETAMINOPHEN 325 MG PO TABS
650.0000 mg | ORAL_TABLET | Freq: Once | ORAL | Status: AC
  Administered 2024-03-17: 650 mg via ORAL
  Filled 2024-03-17: qty 2

## 2024-03-17 MED ORDER — TRASTUZUMAB-ANNS CHEMO 150 MG IV SOLR
6.0000 mg/kg | Freq: Once | INTRAVENOUS | Status: AC
  Administered 2024-03-17: 600 mg via INTRAVENOUS
  Filled 2024-03-17: qty 28.57

## 2024-03-17 NOTE — Patient Instructions (Signed)

## 2024-03-18 ENCOUNTER — Ambulatory Visit
Admission: RE | Admit: 2024-03-18 | Discharge: 2024-03-18 | Disposition: A | Discharge status: Home / Self Care | Source: Ambulatory Visit | Attending: Certified Nurse Midwife | Admitting: Certified Nurse Midwife

## 2024-03-18 DIAGNOSIS — N83299 Other ovarian cyst, unspecified side: Secondary | ICD-10-CM

## 2024-03-18 MED ORDER — GADOPICLENOL 0.5 MMOL/ML IV SOLN
10.0000 mL | Freq: Once | INTRAVENOUS | Status: AC | PRN
  Administered 2024-03-18: 10 mL via INTRAVENOUS

## 2024-03-20 ENCOUNTER — Other Ambulatory Visit

## 2024-03-20 ENCOUNTER — Ambulatory Visit (HOSPITAL_BASED_OUTPATIENT_CLINIC_OR_DEPARTMENT_OTHER): Payer: Self-pay | Admitting: Certified Nurse Midwife

## 2024-04-02 ENCOUNTER — Ambulatory Visit

## 2024-04-02 ENCOUNTER — Ambulatory Visit: Admitting: Adult Health

## 2024-04-06 ENCOUNTER — Other Ambulatory Visit: Payer: Self-pay | Admitting: Adult Health

## 2024-04-06 ENCOUNTER — Telehealth (HOSPITAL_BASED_OUTPATIENT_CLINIC_OR_DEPARTMENT_OTHER): Payer: Self-pay

## 2024-04-06 DIAGNOSIS — C50412 Malignant neoplasm of upper-outer quadrant of left female breast: Secondary | ICD-10-CM

## 2024-04-06 DIAGNOSIS — Z09 Encounter for follow-up examination after completed treatment for conditions other than malignant neoplasm: Secondary | ICD-10-CM

## 2024-04-06 NOTE — Telephone Encounter (Signed)
 The patient called and stated that you left her a Mychart message about setting up an appointment for a endometrial biopsy.  Patient stated that you said you would work her in.  She can not do tomorrow because she has treatment.

## 2024-04-06 NOTE — Progress Notes (Signed)
 Patient tolerating herceptin  well without difficulty.  She has upcoming endometrial biopsy.  Ordered echocardiogram and will reschedule tomorrow's visit to prior to her next treatment.  Patient verbalized understanding and appreciation of call.    Morna Kendall, NP 04/06/24 3:18 PM Medical Oncology and Hematology [facility] [address] [place], [place] [address] Tel. [phone]    Fax. [phone]

## 2024-04-07 ENCOUNTER — Inpatient Hospital Stay: Admitting: Adult Health

## 2024-04-07 ENCOUNTER — Inpatient Hospital Stay: Attending: Hematology and Oncology

## 2024-04-07 ENCOUNTER — Encounter: Payer: Self-pay | Admitting: General Practice

## 2024-04-07 VITALS — BP 92/55 | HR 55 | Temp 98.2°F | Resp 16 | Wt 205.5 lb

## 2024-04-07 DIAGNOSIS — Z170 Estrogen receptor positive status [ER+]: Secondary | ICD-10-CM | POA: Insufficient documentation

## 2024-04-07 DIAGNOSIS — Z5112 Encounter for antineoplastic immunotherapy: Secondary | ICD-10-CM | POA: Diagnosis present

## 2024-04-07 DIAGNOSIS — C50412 Malignant neoplasm of upper-outer quadrant of left female breast: Secondary | ICD-10-CM | POA: Diagnosis present

## 2024-04-07 DIAGNOSIS — Z1721 Progesterone receptor positive status: Secondary | ICD-10-CM | POA: Insufficient documentation

## 2024-04-07 DIAGNOSIS — Z79810 Long term (current) use of selective estrogen receptor modulators (SERMs): Secondary | ICD-10-CM | POA: Insufficient documentation

## 2024-04-07 DIAGNOSIS — Z1731 Human epidermal growth factor receptor 2 positive status: Secondary | ICD-10-CM | POA: Insufficient documentation

## 2024-04-07 MED ORDER — DIPHENHYDRAMINE HCL 25 MG PO CAPS
25.0000 mg | ORAL_CAPSULE | Freq: Once | ORAL | Status: AC
  Administered 2024-04-07: 25 mg via ORAL
  Filled 2024-04-07: qty 1

## 2024-04-07 MED ORDER — TRASTUZUMAB-ANNS CHEMO 150 MG IV SOLR
6.0000 mg/kg | Freq: Once | INTRAVENOUS | Status: AC
  Administered 2024-04-07: 600 mg via INTRAVENOUS
  Filled 2024-04-07: qty 28.57

## 2024-04-07 MED ORDER — ACETAMINOPHEN 325 MG PO TABS
650.0000 mg | ORAL_TABLET | Freq: Once | ORAL | Status: AC
  Administered 2024-04-07: 650 mg via ORAL
  Filled 2024-04-07: qty 2

## 2024-04-07 MED ORDER — SODIUM CHLORIDE 0.9 % IV SOLN: INTRAVENOUS | Status: DC

## 2024-04-07 NOTE — Patient Instructions (Signed)

## 2024-04-07 NOTE — Progress Notes (Signed)
 SPIRITUAL CARE AND COUNSELING CONSULT NOTE   VISIT SUMMARY    Provided Spiritual Care check-in in infusion. Donna [name] reports that she is doing well overall and was very pleased to receive encouragement and affirmation about her treatment progress. Her affect was considerably more upbeat than in early encounters, and she reports looking forward to being done with treatment.  SPIRITUAL ENCOUNTER                                                                                                                                                                      Type of Visit: Follow up Care provided to:: Patient Referral source: Chaplain assessment Reason for visit: Routine spiritual support  SPIRITUAL FRAMEWORK  Presenting Themes: Courage hope and growth Strengths: Donna [name] is excited that she is close to finishing treatment Patient Stress Factors: Loss of control   GOALS   Self/Personal Goals: Complete treatment Clinical Care Goals: Provide ongoing support availability as patient desires   INTERVENTIONS   Spiritual Care Interventions Made: Compassionate presence, Reflective listening, Normalization of emotions, Encouragement (Affirmed strengths)   INTERVENTION OUTCOMES   Outcomes: Connection to values and goals of care, Awareness of support  SPIRITUAL CARE PLAN   Follow up plan : Patient knows to contact chaplain whenever needed/desired.    [address] Olam Corr[place], [place], [facility] Pager [phone] Voicemail [phone]

## 2024-04-09 ENCOUNTER — Encounter (HOSPITAL_BASED_OUTPATIENT_CLINIC_OR_DEPARTMENT_OTHER): Payer: Self-pay | Admitting: Obstetrics & Gynecology

## 2024-04-09 ENCOUNTER — Other Ambulatory Visit (HOSPITAL_COMMUNITY)
Admission: RE | Admit: 2024-04-09 | Discharge: 2024-04-09 | Disposition: A | Discharge status: Home / Self Care | Source: Ambulatory Visit | Attending: Obstetrics and Gynecology | Admitting: Obstetrics and Gynecology

## 2024-04-09 ENCOUNTER — Ambulatory Visit (HOSPITAL_BASED_OUTPATIENT_CLINIC_OR_DEPARTMENT_OTHER): Admitting: Obstetrics & Gynecology

## 2024-04-09 VITALS — BP 101/72 | HR 79 | Wt 208.0 lb

## 2024-04-09 DIAGNOSIS — R935 Abnormal findings on diagnostic imaging of other abdominal regions, including retroperitoneum: Secondary | ICD-10-CM | POA: Diagnosis present

## 2024-04-09 DIAGNOSIS — Z79810 Long term (current) use of selective estrogen receptor modulators (SERMs): Secondary | ICD-10-CM

## 2024-04-09 DIAGNOSIS — Z8742 Personal history of other diseases of the female genital tract: Secondary | ICD-10-CM

## 2024-04-09 DIAGNOSIS — Z170 Estrogen receptor positive status [ER+]: Secondary | ICD-10-CM

## 2024-04-09 NOTE — Progress Notes (Signed)
 GYNECOLOGY  VISIT  CC:   Heavy bleeding, on Tamoxifen    HPI: 33 y.o. G0P0000 Single Black or African American female here for endometrial biopsy. Patient reports missing period two months ago and last month her period was very heavy. She reports that this is the first time that this has ever happened to her. She is currently taking Tamoxifen  for breast cancer and was told that this may cause her periods to eventually stop completely but that has not happened.  Also, pt had ultrasound in August that showed uterus measuring 6 x 4.6 x 5.2cm and a 5.2cm x 4.0 cm x 4.5cm complex left ovarian cyst.  There was also a 5.3cm simple right ovarian cyst noted.  Follow up MRI was recommended and this was done 03/18/2024 showing bulky right ovary with multiple cysts/follicles with largest measuring 2.2 x 2.9cm.  There were two areas within the endometrium that are 7mm and less and may be small polyps.  Because of this and heavy bleeding, endometrial biopsy recommended.  I did review these results with her and answered all of her questions.    Diagnosed with Grade 3 invasive ductal ca with high grade DCIS, negative lymph noes earlier this year.  Treated with lumpectomy.  She declined chemotherapy but is doing herceptin  and is on tamoxifen .  Also has declined radiation therapy.  Had negative genetic testing.   No pregnancy concerns.  Given bleeding, she does need endometrial biopsy.    LMP: 03/19/2024.  Negative pap with neg HR HPV 01/15/2024.  Negative GC/Chl as well.  Past Medical History:  Diagnosis Date   Breast cancer (HCC) 08/23/2023    MEDS:  Reviewed in EPIC  ALLERGIES: Patient has no known allergies.  SH:  partnered, non smoker  Review of Systems  Constitutional: Negative.   Genitourinary:        Heavy menstrual bleeding    PHYSICAL EXAMINATION:    BP 101/72 (BP Location: Right Arm, Patient Position: Sitting, Cuff Size: Normal)   Pulse 79   Wt 208 lb (94.3 kg)   LMP 03/19/2024  (Approximate)   SpO2 98%   BMI 25.32 kg/m     General appearance: alert, cooperative and appears stated age  Lymph:  no inguinal LAD noted  Pelvic: External genitalia:  no lesions              Urethra:  normal appearing urethra with no masses, tenderness or lesions              Bartholins and Skenes: normal                 Vagina: normal mucosa without prolapse or lesions              Cervix: no lesions              Bimanual Exam:  Uterus:  normal size, contour, position, consistency, mobility, non-tender              Adnexa: no mass, fullness, tenderness  Endometrial biopsy recommended.  Discussed with patient.  Verbal and written consent obtained.   Procedure:  Speculum placed.  Cervix visualized and cleansed with betadine prep.  A single toothed tenaculum was applied to the anterior lip of the cervix.  Endometrial pipelle was advanced through the cervix into the endometrial cavity without difficulty.  Pipelle passed to 8cm.  Suction applied and pipelle removed with scant tissue sample obtained so second pass was performed without difficulty.  Tenculum removed.  No  bleeding noted.  Patient tolerated procedure well.  She did have a mild vagal response when sitting up initially.  This resolved quickly with lying back and drinking some sprite.  Repeat BP with sitting after procedure was 100/68.  Pt able to ambulate out of office on her own.    Chaperone was present for exam.  Assessment/Plan: 1. Abnormal ultrasound of endometrium (Primary) - endometrial biopsy obtained today - Surgical pathology( [place]/ [facility])  2. History of ovarian cyst - resolution of complex left ovarian cyst and large right ovarian cyst noted with most recent MRI  3. Malignant neoplasm of upper-outer quadrant of left breast in female, estrogen receptor positive (HCC) - followed by Dr. Odean.  On Tamoxifen  and herceptin .  Declined chemotherapy and radiation.  4. Use of tamoxifen  (Nolvadex )

## 2024-04-13 LAB — SURGICAL PATHOLOGY

## 2024-04-15 ENCOUNTER — Ambulatory Visit (HOSPITAL_BASED_OUTPATIENT_CLINIC_OR_DEPARTMENT_OTHER): Payer: Self-pay | Admitting: Obstetrics & Gynecology

## 2024-04-21 ENCOUNTER — Encounter: Payer: Self-pay | Admitting: Hematology and Oncology

## 2024-04-28 ENCOUNTER — Inpatient Hospital Stay

## 2024-04-28 ENCOUNTER — Inpatient Hospital Stay: Admitting: Hematology and Oncology

## 2024-04-28 VITALS — BP 130/77 | HR 74 | Temp 98.0°F | Resp 18 | Ht 76.0 in | Wt 210.0 lb

## 2024-04-28 DIAGNOSIS — C50412 Malignant neoplasm of upper-outer quadrant of left female breast: Secondary | ICD-10-CM

## 2024-04-28 DIAGNOSIS — Z170 Estrogen receptor positive status [ER+]: Secondary | ICD-10-CM

## 2024-04-28 MED ORDER — SODIUM CHLORIDE 0.9 % IV SOLN: INTRAVENOUS | Status: DC

## 2024-04-28 MED ORDER — TRASTUZUMAB-ANNS CHEMO 150 MG IV SOLR
6.0000 mg/kg | Freq: Once | INTRAVENOUS | Status: AC
  Administered 2024-04-28: 600 mg via INTRAVENOUS
  Filled 2024-04-28: qty 28.57

## 2024-04-28 MED ORDER — ACETAMINOPHEN 325 MG PO TABS
650.0000 mg | ORAL_TABLET | Freq: Once | ORAL | Status: AC
  Administered 2024-04-28: 650 mg via ORAL
  Filled 2024-04-28: qty 2

## 2024-04-28 MED ORDER — DIPHENHYDRAMINE HCL 25 MG PO CAPS
25.0000 mg | ORAL_CAPSULE | Freq: Once | ORAL | Status: AC
  Administered 2024-04-28: 25 mg via ORAL
  Filled 2024-04-28: qty 1

## 2024-04-28 NOTE — Progress Notes (Signed)
 Patient Care Team: Caudle, Thersia Bitters, FNP as PCP - General (Family Medicine) Tyree Nanetta SAILOR, RN as Oncology Nurse Navigator Vanderbilt Ned, MD as Consulting Physician (General Surgery) Odean Potts, MD as Consulting Physician (Hematology and Oncology) Dewey Rush, MD as Consulting Physician (Radiation Oncology)  DIAGNOSIS:  Encounter Diagnosis  Name Primary?   Malignant neoplasm of upper-outer quadrant of left breast in female, estrogen receptor positive (HCC) Yes    SUMMARY OF ONCOLOGIC HISTORY: Oncology History  Malignant neoplasm of upper-outer quadrant of left breast in female, estrogen receptor positive (HCC)  08/23/2023 Initial Diagnosis   Palpable left breast mass at 2 o'clock position measuring 3.1 cm, 1 axillary lymph node biopsy: Concordant, D density breast, biopsy: Grade 2 IDC ER 90%, PR 95%, Ki67 30%, HER2 3+ positive   09/04/2023 Cancer Staging   Staging form: Breast, AJCC 8th Edition - Clinical: Stage IB (cT2, cN0, cM0, G2, ER+, PR+, HER2+) - Signed by Odean Potts, MD on 09/04/2023 Stage prefix: Initial diagnosis Histologic grading system: 3 grade system   09/12/2023 Genetic Testing   Negative [facility] CancerNext-Expanded +RNAinsight Panel.  Report date is 09/13/2023.   The CancerNext-Expanded gene panel offered by [facility] and includes sequencing, rearrangement, and RNA analysis for the following 76 genes: AIP, ALK, APC, ATM, AXIN2, BAP1, BARD1, BMPR1A, BRCA1, BRCA2, BRIP1, CDC73, CDH1, CDK4, CDKN1B, CDKN2A, CEBPA, CHEK2, CTNNA1, DDX41, DICER1, ETV6, FH, FLCN, GATA2, LZTR1, MAX, MBD4, MEN1, MET, MLH1, MSH2, MSH3, MSH6, MUTYH, NF1, NF2, NTHL1, PALB2, PHOX2B, PMS2, POT1, PRKAR1A, PTCH1, PTEN, RAD51C, RAD51D, RB1, RET, RUNX1, SDHA, SDHAF2, SDHB, SDHC, SDHD, SMAD4, SMARCA4, SMARCB1, SMARCE1, STK11, SUFU, TMEM127, TP53, TSC1, TSC2, VHL, and WT1 (sequencing and deletion/duplication); EGFR, HOXB13, KIT, MITF, PDGFRA, POLD1, and POLE (sequencing only); EPCAM and GREM1  (deletion/duplication only).    10/14/2023 -  Chemotherapy   Patient is on Treatment Plan : BREAST MAINTENANCE Trastuzumab  IV (6) or SQ (600) D1 q21d X 11 Cycles     11/05/2023 Surgery   Left lumpectomy: Grade 3 IDC 3 cm with high-grade DCIS, margins negative, 0/4 lymph nodes negative, ER 90%, PR 95%, HER2 3+ positive, Ki67 30%   11/28/2023 Cancer Staging   Staging form: Breast, AJCC 8th Edition - Pathologic: Stage IA (pT2, pN0, cM0, G3, ER+, PR+, HER2+) - Signed by Odean Potts, MD on 11/28/2023 Histologic grading system: 3 grade system     CHIEF COMPLIANT: Final treatment of Herceptin   HISTORY OF PRESENT ILLNESS:  History of Present Illness Donna [name] is a 33 year old with breast cancer who presents for a follow-up visit after completing treatment.  She takes tamoxifen  daily. She has sharp jabs of pain near the breast incision and bone. She is scheduled for a mammogram in March for surveillance.  She has heavy, irregular menstrual cycles. She recently had one month of amenorrhea followed by a very heavy cycle requiring super plus tampons with poor control. Bleeding has slowed but remains irregular.  Pelvic ultrasound and MRI showed a bulky right ovary with internal follicles and two foci in the upper uterus. She has not yet contacted her gynecologist about these findings.     ALLERGIES:  has no known allergies.  MEDICATIONS:  Current Outpatient Medications  Medication Sig Dispense Refill   tamoxifen  (NOLVADEX ) 20 MG tablet Take 1 tablet (20 mg total) by mouth daily. 90 tablet 3   No current facility-administered medications for this visit.    PHYSICAL EXAMINATION: ECOG PERFORMANCE STATUS: 1 - Symptomatic but completely ambulatory  There were no  vitals filed for this visit. There were no vitals filed for this visit.  Physical Exam BREAST: Breasts with normal lumpiness.  (exam performed in the presence of a chaperone)  LABORATORY DATA:  I have reviewed the  data as listed    Latest Ref Rng & Units 11/10/2023    5:32 PM 10/14/2023    8:34 AM 09/04/2023   12:33 PM  CMP  Glucose 70 - 99 mg/dL 81  98  87   BUN 6 - 20 mg/dL 5  8  8    Creatinine 0.44 - 1.00 mg/dL 9.02  9.04  9.14   Sodium 135 - 145 mmol/L 140  141  139   Potassium 3.5 - 5.1 mmol/L 3.9  3.3  4.4   Chloride 98 - 111 mmol/L 108  111  106   CO2 22 - 32 mmol/L 23  25  30    Calcium 8.9 - 10.3 mg/dL 9.2  8.3  9.2   Total Protein 6.5 - 8.1 g/dL 6.9  6.3  7.2   Total Bilirubin 0.0 - 1.2 mg/dL 0.6  0.3  0.5   Alkaline Phos 38 - 126 U/L 32  45  47   AST 15 - 41 U/L 28  25  16    ALT 0 - 44 U/L 23  33  13     Lab Results  Component Value Date   WBC 6.0 11/10/2023   HGB 13.8 11/10/2023   HCT 41.8 11/10/2023   MCV 87.6 11/10/2023   PLT 178 11/10/2023   NEUTROABS 3.2 11/10/2023    ASSESSMENT & PLAN:  Malignant neoplasm of upper-outer quadrant of left breast in female, estrogen receptor positive (HCC) 08/23/2023:Palpable left breast mass at 2 o'clock position measuring 3.1 cm, 1 axillary lymph node biopsy: Concordant, D density breast, biopsy: Grade 2 IDC ER 90%, PR 95%, Ki67 30%, HER2 3+ positive    Recommendation  1.  Declined neoadjuvant chemotherapy.  Started tamoxifen  with Herceptin  injections on 10/11/2023 2. 11/05/2023 left lumpectomy: Grade 3 IDC 3 cm with high-grade DCIS, margins negative, 0/4 lymph nodes negative, ER 90%, PR 95%, HER2 3+ positive, Ki67 30% 3.  Declined radiation Genetic testing ----------------------------------------------------------------------- Tamoxifen  toxicities: Heavy bleeding and clotting.  She had an MRI of the pelvis which suggested endometrial polyp/hyperplasia.  She will call and make another appointment with gynecology to discussed options for treatment. 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   This concludes her treatment and we will see her back again in 1 year. Assessment  & Plan Estrogen receptor positive malignant neoplasm of upper-outer quadrant of left breast, status post-treatment, on tamoxifen  Completed treatment. On tamoxifen  20 mg daily. Incision site pain likely due to scar tissue. - Continue tamoxifen  20 mg oral daily. - Advised wearing compression socks during long drives to reduce risk of tamoxifen -associated blood clots. - Scheduled annual follow-up appointments.  Abnormal uterine bleeding with possible endometrial polyp or hyperplasia Irregular heavy bleeding. Imaging suggests possible polyps or hyperplasia. Non-cancerous but causes bleeding. Gynecologist may consider D&C or ablation. - Follow up with gynecologist for evaluation and management. - Contact gynecologist if no response. - Send MyChart message if unable to reach gynecologist.      No orders of the defined types were placed in this encounter.  The patient has a good understanding of the overall plan. she agrees with it. she will call with any problems that may develop before the next visit here.  I personally spent a total of 30 minutes in the care of the patient today including preparing to see the patient, getting/reviewing separately obtained history, performing a medically appropriate exam/evaluation, counseling and educating, placing orders, referring and communicating with other health care professionals, documenting clinical information in the EHR, independently interpreting results, communicating results, and coordinating care.   Viinay K Paulette Rockford, MD 04/28/24

## 2024-04-28 NOTE — Assessment & Plan Note (Signed)
 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  Declined neoadjuvant chemotherapy.  Started tamoxifen  with Herceptin  injections on 10/11/2023 2. 11/05/2023 left lumpectomy: Grade 3 IDC 3 cm with high-grade DCIS, margins negative, 0/4 lymph nodes negative, ER 90%, PR 95%, HER2 3+ positive, Ki67 30% 3.  Declined radiation Genetic testing ----------------------------------------------------------------------- Tamoxifen  toxicities: Heavy bleeding and clotting.  I would like to refer her to GYN for an evaluation.    Return to clinic every 3 weeks for Herceptin .

## 2024-04-28 NOTE — Patient Instructions (Signed)

## 2024-04-29 ENCOUNTER — Encounter: Payer: Self-pay | Admitting: *Deleted

## 2024-04-29 ENCOUNTER — Ambulatory Visit (HOSPITAL_COMMUNITY)
Admission: RE | Admit: 2024-04-29 | Discharge: 2024-04-29 | Disposition: A | Discharge status: Home / Self Care | Source: Ambulatory Visit | Attending: Adult Health | Admitting: Adult Health

## 2024-04-29 ENCOUNTER — Other Ambulatory Visit: Payer: Self-pay

## 2024-04-29 DIAGNOSIS — Z01818 Encounter for other preprocedural examination: Secondary | ICD-10-CM | POA: Diagnosis present

## 2024-04-29 DIAGNOSIS — I517 Cardiomegaly: Secondary | ICD-10-CM | POA: Diagnosis not present

## 2024-04-29 DIAGNOSIS — Z09 Encounter for follow-up examination after completed treatment for conditions other than malignant neoplasm: Secondary | ICD-10-CM | POA: Diagnosis not present

## 2024-04-29 DIAGNOSIS — C50412 Malignant neoplasm of upper-outer quadrant of left female breast: Secondary | ICD-10-CM

## 2024-04-29 DIAGNOSIS — Z0189 Encounter for other specified special examinations: Secondary | ICD-10-CM | POA: Diagnosis not present

## 2024-04-29 DIAGNOSIS — Z170 Estrogen receptor positive status [ER+]: Secondary | ICD-10-CM | POA: Diagnosis not present

## 2024-04-29 LAB — ECHOCARDIOGRAM COMPLETE
Area-P 1/2: 3.07 cm2
S' Lateral: 3.1 cm

## 2024-05-01 ENCOUNTER — Ambulatory Visit

## 2024-05-05 ENCOUNTER — Inpatient Hospital Stay

## 2024-05-05 ENCOUNTER — Inpatient Hospital Stay: Admitting: Adult Health

## 2024-06-23 ENCOUNTER — Telehealth: Payer: Self-pay | Admitting: *Deleted

## 2024-06-23 NOTE — Telephone Encounter (Signed)
 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  RN attempt x2 to return call, no answer.  LVM for pt to return call to the office.

## 2024-07-06 ENCOUNTER — Inpatient Hospital Stay: Attending: Hematology and Oncology | Admitting: Adult Health

## 2024-07-06 DIAGNOSIS — C50412 Malignant neoplasm of upper-outer quadrant of left female breast: Secondary | ICD-10-CM

## 2024-07-06 DIAGNOSIS — Z170 Estrogen receptor positive status [ER+]: Secondary | ICD-10-CM

## 2024-07-08 ENCOUNTER — Other Ambulatory Visit: Payer: Self-pay | Admitting: *Deleted

## 2024-07-08 MED ORDER — TAMOXIFEN CITRATE 20 MG PO TABS: 20.0000 mg | ORAL_TABLET | Freq: Every day | ORAL | 3 refills | Status: AC

## 2024-07-08 NOTE — Telephone Encounter (Signed)
 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  RN successfully sent prescription to pharmacy per pt request.

## 2024-08-24 ENCOUNTER — Encounter

## 2025-02-09 ENCOUNTER — Encounter (HOSPITAL_BASED_OUTPATIENT_CLINIC_OR_DEPARTMENT_OTHER): Admitting: Family Medicine

## 2025-04-28 ENCOUNTER — Inpatient Hospital Stay: Admitting: Hematology and Oncology
